# Patient Record
Sex: Female | Born: 1975 | Race: White | Hispanic: No | Marital: Married | Smoking: Never smoker
Health system: Southern US, Community
[De-identification: ages and names within clinical notes are randomized; demographics above are authoritative.]

## PROBLEM LIST (undated history)

## (undated) DIAGNOSIS — E782 Mixed hyperlipidemia: Secondary | ICD-10-CM

## (undated) DIAGNOSIS — R7303 Prediabetes: Secondary | ICD-10-CM

## (undated) DIAGNOSIS — Z30431 Encounter for routine checking of intrauterine contraceptive device: Secondary | ICD-10-CM

## (undated) DIAGNOSIS — Z973 Presence of spectacles and contact lenses: Secondary | ICD-10-CM

## (undated) DIAGNOSIS — Z8669 Personal history of other diseases of the nervous system and sense organs: Secondary | ICD-10-CM

## (undated) DIAGNOSIS — N8300 Follicular cyst of ovary, unspecified side: Secondary | ICD-10-CM

## (undated) DIAGNOSIS — Z6832 Body mass index (BMI) 32.0-32.9, adult: Secondary | ICD-10-CM

## (undated) DIAGNOSIS — I10 Essential (primary) hypertension: Secondary | ICD-10-CM

## (undated) DIAGNOSIS — R6882 Decreased libido: Secondary | ICD-10-CM

## (undated) DIAGNOSIS — E789 Disorder of lipoprotein metabolism, unspecified: Secondary | ICD-10-CM

## (undated) DIAGNOSIS — R87619 Unspecified abnormal cytological findings in specimens from cervix uteri: Secondary | ICD-10-CM

## (undated) DIAGNOSIS — E669 Obesity, unspecified: Secondary | ICD-10-CM

## (undated) DIAGNOSIS — N63 Unspecified lump in unspecified breast: Secondary | ICD-10-CM

## (undated) HISTORY — DX: Personal history of other diseases of the nervous system and sense organs: Z86.69

## (undated) HISTORY — DX: Disorder of lipoprotein metabolism, unspecified: E78.9

## (undated) HISTORY — DX: Encounter for routine checking of intrauterine contraceptive device: Z30.431

## (undated) HISTORY — PX: NO PAST SURGERIES: SHX2092

## (undated) HISTORY — DX: Unspecified lump in unspecified breast: N63.0

## (undated) HISTORY — DX: Follicular cyst of ovary, unspecified side: N83.00

## (undated) HISTORY — DX: Obesity, unspecified: E66.9

## (undated) HISTORY — DX: Body mass index (BMI) 32.0-32.9, adult: Z68.32

## (undated) HISTORY — DX: Decreased libido: R68.82

---

## 1999-07-03 ENCOUNTER — Other Ambulatory Visit: Admission: RE | Admit: 1999-07-03 | Discharge: 1999-07-03 | Payer: Self-pay | Admitting: *Deleted

## 2000-02-06 ENCOUNTER — Encounter: Payer: Self-pay | Admitting: General Surgery

## 2000-02-06 ENCOUNTER — Encounter: Admission: RE | Admit: 2000-02-06 | Discharge: 2000-02-06 | Payer: Self-pay | Admitting: Unknown Physician Specialty

## 2000-07-03 ENCOUNTER — Other Ambulatory Visit: Admission: RE | Admit: 2000-07-03 | Discharge: 2000-07-03 | Payer: Self-pay | Admitting: *Deleted

## 2001-06-22 ENCOUNTER — Inpatient Hospital Stay (HOSPITAL_COMMUNITY): Admission: AD | Admit: 2001-06-22 | Discharge: 2001-06-25 | Payer: Self-pay | Admitting: Obstetrics and Gynecology

## 2001-07-31 ENCOUNTER — Other Ambulatory Visit: Admission: RE | Admit: 2001-07-31 | Discharge: 2001-07-31 | Payer: Self-pay | Admitting: Obstetrics and Gynecology

## 2002-10-02 ENCOUNTER — Other Ambulatory Visit: Admission: RE | Admit: 2002-10-02 | Discharge: 2002-10-02 | Payer: Self-pay | Admitting: Obstetrics and Gynecology

## 2003-11-05 ENCOUNTER — Ambulatory Visit (HOSPITAL_COMMUNITY): Admission: RE | Admit: 2003-11-05 | Discharge: 2003-11-05 | Payer: Self-pay | Admitting: Orthopedic Surgery

## 2003-11-09 ENCOUNTER — Other Ambulatory Visit: Admission: RE | Admit: 2003-11-09 | Discharge: 2003-11-09 | Payer: Self-pay | Admitting: Obstetrics and Gynecology

## 2005-06-25 ENCOUNTER — Inpatient Hospital Stay (HOSPITAL_COMMUNITY): Admission: AD | Admit: 2005-06-25 | Discharge: 2005-06-27 | Payer: Self-pay | Admitting: Obstetrics and Gynecology

## 2005-06-25 ENCOUNTER — Encounter (INDEPENDENT_AMBULATORY_CARE_PROVIDER_SITE_OTHER): Payer: Self-pay | Admitting: *Deleted

## 2006-08-14 ENCOUNTER — Encounter: Admission: RE | Admit: 2006-08-14 | Discharge: 2006-08-14 | Payer: Self-pay | Admitting: Obstetrics and Gynecology

## 2007-02-21 ENCOUNTER — Encounter: Admission: RE | Admit: 2007-02-21 | Discharge: 2007-02-21 | Payer: Self-pay | Admitting: Obstetrics and Gynecology

## 2007-09-08 ENCOUNTER — Encounter: Admission: RE | Admit: 2007-09-08 | Discharge: 2007-09-08 | Payer: Self-pay | Admitting: Obstetrics and Gynecology

## 2008-09-20 ENCOUNTER — Encounter: Admission: RE | Admit: 2008-09-20 | Discharge: 2008-09-20 | Payer: Self-pay | Admitting: Obstetrics and Gynecology

## 2012-04-02 ENCOUNTER — Other Ambulatory Visit: Payer: Self-pay | Admitting: Obstetrics and Gynecology

## 2012-04-02 DIAGNOSIS — Z1231 Encounter for screening mammogram for malignant neoplasm of breast: Secondary | ICD-10-CM

## 2012-04-10 ENCOUNTER — Other Ambulatory Visit: Payer: Self-pay | Admitting: Obstetrics and Gynecology

## 2012-04-10 ENCOUNTER — Ambulatory Visit
Admission: RE | Admit: 2012-04-10 | Discharge: 2012-04-10 | Disposition: A | Discharge status: Home / Self Care | Payer: PRIVATE HEALTH INSURANCE | Source: Ambulatory Visit | Attending: Obstetrics and Gynecology | Admitting: Obstetrics and Gynecology

## 2012-04-10 DIAGNOSIS — N6320 Unspecified lump in the left breast, unspecified quadrant: Secondary | ICD-10-CM

## 2012-04-10 DIAGNOSIS — Z1231 Encounter for screening mammogram for malignant neoplasm of breast: Secondary | ICD-10-CM

## 2012-04-11 ENCOUNTER — Other Ambulatory Visit: Payer: Self-pay | Admitting: Obstetrics and Gynecology

## 2012-04-11 DIAGNOSIS — N6489 Other specified disorders of breast: Secondary | ICD-10-CM

## 2012-04-11 DIAGNOSIS — R922 Inconclusive mammogram: Secondary | ICD-10-CM

## 2012-04-11 DIAGNOSIS — N6320 Unspecified lump in the left breast, unspecified quadrant: Secondary | ICD-10-CM

## 2012-05-01 ENCOUNTER — Ambulatory Visit
Admission: RE | Admit: 2012-05-01 | Discharge: 2012-05-01 | Disposition: A | Discharge status: Home / Self Care | Payer: PRIVATE HEALTH INSURANCE | Source: Ambulatory Visit | Attending: Obstetrics and Gynecology | Admitting: Obstetrics and Gynecology

## 2012-05-01 DIAGNOSIS — N6320 Unspecified lump in the left breast, unspecified quadrant: Secondary | ICD-10-CM

## 2012-05-01 DIAGNOSIS — N6489 Other specified disorders of breast: Secondary | ICD-10-CM

## 2012-05-01 DIAGNOSIS — R922 Inconclusive mammogram: Secondary | ICD-10-CM

## 2012-06-18 HISTORY — PX: COLONOSCOPY WITH PROPOFOL: SHX5780

## 2013-04-10 ENCOUNTER — Other Ambulatory Visit: Payer: Self-pay | Admitting: Obstetrics and Gynecology

## 2013-04-10 DIAGNOSIS — N6320 Unspecified lump in the left breast, unspecified quadrant: Secondary | ICD-10-CM

## 2013-05-04 ENCOUNTER — Other Ambulatory Visit: Payer: PRIVATE HEALTH INSURANCE

## 2013-08-10 ENCOUNTER — Ambulatory Visit
Admission: RE | Admit: 2013-08-10 | Discharge: 2013-08-10 | Disposition: A | Discharge status: Home / Self Care | Payer: PRIVATE HEALTH INSURANCE | Source: Ambulatory Visit | Attending: Obstetrics and Gynecology | Admitting: Obstetrics and Gynecology

## 2013-08-10 DIAGNOSIS — N6320 Unspecified lump in the left breast, unspecified quadrant: Secondary | ICD-10-CM

## 2015-10-06 ENCOUNTER — Encounter: Payer: Self-pay | Admitting: Cardiovascular Disease

## 2015-10-06 ENCOUNTER — Ambulatory Visit (INDEPENDENT_AMBULATORY_CARE_PROVIDER_SITE_OTHER): Payer: PRIVATE HEALTH INSURANCE | Admitting: Cardiovascular Disease

## 2015-10-06 VITALS — BP 132/110 | HR 73 | Ht 71.0 in | Wt 242.2 lb

## 2015-10-06 DIAGNOSIS — I10 Essential (primary) hypertension: Secondary | ICD-10-CM | POA: Diagnosis not present

## 2015-10-06 DIAGNOSIS — E785 Hyperlipidemia, unspecified: Secondary | ICD-10-CM | POA: Diagnosis not present

## 2015-10-06 LAB — COMPREHENSIVE METABOLIC PANEL
ALT: 43 U/L — ABNORMAL HIGH (ref 6–29)
AST: 36 U/L — AB (ref 10–30)
Albumin: 4.6 g/dL (ref 3.6–5.1)
Alkaline Phosphatase: 60 U/L (ref 33–115)
BILIRUBIN TOTAL: 0.8 mg/dL (ref 0.2–1.2)
BUN: 12 mg/dL (ref 7–25)
CHLORIDE: 104 mmol/L (ref 98–110)
CO2: 26 mmol/L (ref 20–31)
CREATININE: 0.65 mg/dL (ref 0.50–1.10)
Calcium: 9.7 mg/dL (ref 8.6–10.2)
GLUCOSE: 91 mg/dL (ref 65–99)
Potassium: 4.6 mmol/L (ref 3.5–5.3)
SODIUM: 139 mmol/L (ref 135–146)
Total Protein: 7.4 g/dL (ref 6.1–8.1)

## 2015-10-06 LAB — LIPID PANEL
CHOL/HDL RATIO: 6.9 ratio — AB (ref <=5.0)
Cholesterol: 207 mg/dL — ABNORMAL HIGH (ref 125–200)
HDL: 30 mg/dL — AB (ref >=46)
LDL CALC: 139 mg/dL — AB (ref <130)
Triglycerides: 190 mg/dL — ABNORMAL HIGH (ref <150)
VLDL: 38 mg/dL — AB (ref <30)

## 2015-10-06 NOTE — Patient Instructions (Addendum)
Medication Instructions:  Your physician recommends that you continue on your current medications as directed.    Labwork: TODAY - cholesterol, complete metabolic panel   Testing/Procedures: None Ordered   Follow-Up: Your physician recommends that you schedule a follow-up appointment in: 3 months with Dr. Elease HashimotoNahser   If you need a refill on your cardiac medications before your next appointment, please call your pharmacy.   Thank you for choosing [facility]! Eligha BridegroomMichelle Swinyer, RN [phone]

## 2015-10-06 NOTE — Progress Notes (Signed)
Cardiology Office Note   Date:  10/06/2015   ID:  Joan Allen, DOB 1976-05-10, MRN [id number]  PCP:  Ginette Otto, MD  Cardiologist:   Vesta Mixer, MD   Chief Complaint  Patient presents with  . Hyperlipidemia   Problem list 1. Hyperlipidemia.   History of Present Illness: Joan Allen is a 40 y.o. female who presents for further evaluation of her hyperlipidemia.  She's had elevated cholesterol level since her 20s.  She's never been put on any medications. She's tried to improve her diet. No CP with exertion.   Does have some DOE . No DOE doing normal household activities.    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    Past Medical History  Diagnosis Date  . Surveillance of intrauterine contraceptive device   . History of migraine headaches   . Body mass index 32.0-32.9, adult   . Decreased libido   . Follicular cyst of ovary   . Lump or mass in breast   . Abnormal cholesterol test   . Obese     Past Surgical History  Procedure Laterality Date  . No past surgeries       Current Outpatient Prescriptions  Medication Sig Dispense Refill  . levonorgestrel (MIRENA) 20 MCG/24HR IUD 1 each by Intrauterine route once.     No current facility-administered medications for this visit.    Allergies:   Review of patient's allergies indicates no known allergies.    Social History:  The patient  reports that she has never smoked. She does not have any smokeless tobacco history on file. She reports that she drinks alcohol. She reports that she does not use illicit drugs.   Family History:  The patient's family history includes Cancer in her father and maternal grandmother; Diabetes in her maternal grandfather; Hypertension in her mother; Kidney Stones in her mother; Stroke in her maternal grandfather.    ROS:  Please see the history of present illness.    Review of  Systems: Constitutional:  denies fever, chills, diaphoresis, appetite change and fatigue.  HEENT: denies photophobia, eye pain, redness, hearing loss, ear pain, congestion, sore throat, rhinorrhea, sneezing, neck pain, neck stiffness and tinnitus.  Respiratory: admits to  DOE,  .  Cardiovascular: denies chest pain, palpitations and leg swelling.  Gastrointestinal: denies nausea, vomiting, abdominal pain, diarrhea, constipation, blood in stool.  Genitourinary: denies dysuria, urgency, frequency, hematuria, flank pain and difficulty urinating.  Musculoskeletal: denies  myalgias, back pain, joint swelling, arthralgias and gait problem.   Skin: denies pallor, rash and wound.  Neurological: denies dizziness, seizures, syncope, weakness, light-headedness, numbness and headaches.   Hematological: denies adenopathy, easy bruising, personal or family bleeding history.  Psychiatric/ Behavioral: denies suicidal ideation, mood changes, confusion, nervousness, sleep disturbance and agitation.       All other systems are reviewed and negative.    PHYSICAL EXAM: VS:  BP 132/110 mmHg  Pulse 73  Ht  (1.803 m)  Wt 242 lb 3.2 oz (109.861 kg)  BMI 33.79 kg/m2 , BMI Body mass index is 33.79 kg/(m^2). GEN: Well nourished, well developed, in no acute distress HEENT: normal Neck: no JVD, carotid bruits, or masses Cardiac: RRR; no murmurs, rubs, or gallops,no edema  Respiratory:  clear to auscultation bilaterally, normal work of breathing GI: soft, nontender, nondistended, + BS MS: no deformity or atrophy Skin: warm and dry, no rash Neuro:  Strength and sensation are intact Psych: normal   EKG:  EKG is ordered today. The ekg ordered today demonstrates  NSR at 73.  Normal ECG     Recent Labs: No results found for requested labs within last 365 days.    Lipid Panel No results found for: CHOL, TRIG, HDL, CHOLHDL, VLDL, LDLCALC, LDLDIRECT    Wt Readings from Last 3 Encounters:  10/06/15  242 lb 3.2 oz (109.861 kg)      Other studies Reviewed: Additional studies/ records that were reviewed today include: . Review of the above records demonstrates:    ASSESSMENT AND PLAN:  1.  Hyperlipidemia :   She was told that she had elevated cholesterol levels in her 20s. She's never been on any specific diet. She's never taken any medications. I strongly advised her to work on improved diet. I've also advised her to exercise on a regular basis and to work on weight loss.   2. Hypertension: Her diastolic pressure remains elevated. She was on vacation this past week and ate  out quite a bit. I've advised her to eat a low-salt diet.  I'll see her in 3 month for follow-up visit.   Current medicines are reviewed at length with the patient today.  The patient does not have concerns regarding medicines.  The following changes have been made:  no change  Labs/ tests ordered today include:  No orders of the defined types were placed in this encounter.     Disposition:   FU with me in 3 months      Carmen Vallecillo, Deloris PingPhilip J, MD  10/06/2015 10:50 AM    [facility] [address], [place], [place]  [address] Phone: [phone](336) [phone]; Fax: [phone](336) [phone]   [facility]  [address] [place], [place]  [address] [phone]   Fax [phone]

## 2015-10-10 ENCOUNTER — Other Ambulatory Visit: Payer: Self-pay | Admitting: Nurse Practitioner

## 2015-10-10 MED ORDER — ATORVASTATIN CALCIUM 40 MG PO TABS: 40.0000 mg | ORAL_TABLET | Freq: Every day | ORAL | Status: DC

## 2016-01-05 ENCOUNTER — Ambulatory Visit (INDEPENDENT_AMBULATORY_CARE_PROVIDER_SITE_OTHER): Payer: PRIVATE HEALTH INSURANCE | Admitting: Cardiovascular Disease

## 2016-01-05 ENCOUNTER — Encounter (INDEPENDENT_AMBULATORY_CARE_PROVIDER_SITE_OTHER): Payer: Self-pay

## 2016-01-05 ENCOUNTER — Encounter: Payer: Self-pay | Admitting: Cardiovascular Disease

## 2016-01-05 VITALS — BP 116/84 | HR 80 | Ht 71.0 in | Wt 232.1 lb

## 2016-01-05 DIAGNOSIS — E785 Hyperlipidemia, unspecified: Secondary | ICD-10-CM

## 2016-01-05 LAB — LIPID PANEL
CHOL/HDL RATIO: 3.7 ratio (ref <=5.0)
Cholesterol: 114 mg/dL — ABNORMAL LOW (ref 125–200)
HDL: 31 mg/dL — ABNORMAL LOW (ref >=46)
LDL Cholesterol: 54 mg/dL (ref <130)
Triglycerides: 143 mg/dL (ref <150)
VLDL: 29 mg/dL (ref <30)

## 2016-01-05 LAB — COMPREHENSIVE METABOLIC PANEL
ALBUMIN: 4.3 g/dL (ref 3.6–5.1)
ALK PHOS: 65 U/L (ref 33–115)
ALT: 25 U/L (ref 6–29)
AST: 22 U/L (ref 10–30)
BILIRUBIN TOTAL: 0.7 mg/dL (ref 0.2–1.2)
BUN: 9 mg/dL (ref 7–25)
CO2: 25 mmol/L (ref 20–31)
CREATININE: 0.7 mg/dL (ref 0.50–1.10)
Calcium: 9.5 mg/dL (ref 8.6–10.2)
Chloride: 103 mmol/L (ref 98–110)
GLUCOSE: 91 mg/dL (ref 65–99)
Potassium: 4.3 mmol/L (ref 3.5–5.3)
SODIUM: 138 mmol/L (ref 135–146)
Total Protein: 6.9 g/dL (ref 6.1–8.1)

## 2016-01-05 NOTE — Patient Instructions (Signed)
Medication Instructions:  Your physician recommends that you continue on your current medications as directed. Please refer to the Current Medication list given to you today.   Labwork: TODAY - cholesterol, complete metabolic panel   Testing/Procedures: None Ordered   Follow-Up: Your physician wants you to follow-up in: 1 year with Dr. Nahser.  You will receive a reminder letter in the mail two months in advance. If you don't receive a letter, please call our office to schedule the follow-up appointment.   If you need a refill on your cardiac medications before your next appointment, please call your pharmacy.   Thank you for choosing [facility]! Kimiah Hibner, RN [phone]    

## 2016-01-05 NOTE — Progress Notes (Signed)
Cardiology Office Note   Date:  01/05/2016   ID:  Joan Allen, DOB [birth year], MRN [id number]  PCP:  Ginette Otto, MD  Cardiologist:   Kristeen Miss, MD   Chief Complaint  Patient presents with  . Follow-up   Problem list 1. Hyperlipidemia.   October 06, 2015   Joan Allen is a 40 y.o. female who presents for further evaluation of her hyperlipidemia.  She's had elevated cholesterol level since her 20s.  She's never been put on any medications. She's tried to improve her diet. No CP with exertion.   Does have some DOE . No DOE doing normal household activities.   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  January 05, 2016  Joan Allen is seen today for follow up  Doing well. Walking some  Work is stressful  Trying to watch her diet   Past Medical History  Diagnosis Date  . Surveillance of intrauterine contraceptive device   . History of migraine headaches   . Body mass index 32.0-32.9, adult   . Decreased libido   . Follicular cyst of ovary   . Lump or mass in breast   . Abnormal cholesterol test   . Obese     Past Surgical History  Procedure Laterality Date  . No past surgeries       Current Outpatient Prescriptions  Medication Sig Dispense Refill  . atorvastatin (LIPITOR) 40 MG tablet Take 1 tablet (40 mg total) by mouth daily. 30 tablet 11  . levonorgestrel (MIRENA) 20 MCG/24HR IUD 1 each by Intrauterine route once.     No current facility-administered medications for this visit.    Allergies:   Review of patient's allergies indicates no known allergies.    Social History:  The patient  reports that she has never smoked. She does not have any smokeless tobacco history on file. She reports that she drinks alcohol. She reports that she does not use illicit drugs.   Family History:  The patient's family history includes Cancer in her father and maternal grandmother; Diabetes in  her maternal grandfather; Hypertension in her mother; Kidney Stones in her mother; Stroke in her maternal grandfather.    ROS:  Please see the history of present illness.    Review of Systems: Constitutional:  denies fever, chills, diaphoresis, appetite change and fatigue.  HEENT: denies photophobia, eye pain, redness, hearing loss, ear pain, congestion, sore throat, rhinorrhea, sneezing, neck pain, neck stiffness and tinnitus.  Respiratory: admits to  DOE,  .  Cardiovascular: denies chest pain, palpitations and leg swelling.  Gastrointestinal: denies nausea, vomiting, abdominal pain, diarrhea, constipation, blood in stool.  Genitourinary: denies dysuria, urgency, frequency, hematuria, flank pain and difficulty urinating.  Musculoskeletal: denies  myalgias, back pain, joint swelling, arthralgias and gait problem.   Skin: denies pallor, rash and wound.  Neurological: denies dizziness, seizures, syncope, weakness, light-headedness, numbness and headaches.   Hematological: denies adenopathy, easy bruising, personal or family bleeding history.  Psychiatric/ Behavioral: denies suicidal ideation, mood changes, confusion, nervousness, sleep disturbance and agitation.       All other systems are reviewed and negative.    PHYSICAL EXAM: VS:  BP 116/84 mmHg  Pulse 80  Ht  (1.803 m)  Wt 232 lb 1.9 oz (105.289 kg)  BMI 32.39 kg/m2  SpO2 96% , BMI Body mass index is 32.39 kg/(m^2). GEN: Well nourished, well developed, in no acute distress  HEENT: normal Neck: no JVD, carotid bruits, or masses Cardiac: RRR; no murmurs, rubs, or gallops,no edema  Respiratory:  clear to auscultation bilaterally, normal work of breathing GI: soft, nontender, nondistended, + BS MS: no deformity or atrophy Skin: warm and dry, no rash Neuro:  Strength and sensation are intact Psych: normal   EKG:  EKG is ordered today. The ekg ordered today demonstrates  NSR at 73.  Normal ECG     Recent  Labs: 10/06/2015: ALT 43*; BUN 12; Creat 0.65; Potassium 4.6; Sodium 139    Lipid Panel    Component Value Date/Time   CHOL 207* 10/06/2015 1114   TRIG 190* 10/06/2015 1114   HDL 30* 10/06/2015 1114   CHOLHDL 6.9* 10/06/2015 1114   VLDL 38* 10/06/2015 1114   LDLCALC 139* 10/06/2015 1114      Wt Readings from Last 3 Encounters:  01/05/16 232 lb 1.9 oz (105.289 kg)  10/06/15 242 lb 3.2 oz (109.861 kg)      Other studies Reviewed: Additional studies/ records that were reviewed today include: . Review of the above records demonstrates:    ASSESSMENT AND PLAN:  1.  Hyperlipidemia :   She is on Atorvastatin 40 mg a day  Check lipids today    2. Hypertension: Her diastolic pressure remains elevated. She was on vacation this past week and ate  out quite a bit. I've advised her to eat a low-salt diet.  3. Obesity :   Advised continued diet and exercise    Will see her in 1 year   Current medicines are reviewed at length with the patient today.  The patient does not have concerns regarding medicines.  The following changes have been made:  no change  Labs/ tests ordered today include:  No orders of the defined types were placed in this encounter.      Kristeen MissPhilip Malon Branton, MD  01/05/2016 8:33 AM    [facility] [address], [place], [place]  [address] Phone: [phone]([phone]; Fax: [phone]([phone]   [facility]  [address] [place], [place]  [address] [phone]) [phone]   Fax [phone]) [phone]

## 2016-06-18 HISTORY — PX: CATARACT EXTRACTION W/ INTRAOCULAR LENS IMPLANT: SHX1309

## 2016-10-22 ENCOUNTER — Encounter: Payer: Self-pay | Admitting: Cardiovascular Disease

## 2016-10-23 ENCOUNTER — Other Ambulatory Visit: Payer: Self-pay | Admitting: *Deleted

## 2016-10-23 MED ORDER — ATORVASTATIN CALCIUM 40 MG PO TABS: 40.0000 mg | ORAL_TABLET | Freq: Every day | ORAL | 0 refills | Status: DC

## 2017-02-08 ENCOUNTER — Encounter: Payer: Self-pay | Admitting: Cardiovascular Disease

## 2017-02-08 ENCOUNTER — Ambulatory Visit (INDEPENDENT_AMBULATORY_CARE_PROVIDER_SITE_OTHER): Payer: PRIVATE HEALTH INSURANCE | Admitting: Cardiovascular Disease

## 2017-02-08 VITALS — BP 124/90 | HR 83 | Ht 71.0 in | Wt 241.0 lb

## 2017-02-08 DIAGNOSIS — E782 Mixed hyperlipidemia: Secondary | ICD-10-CM

## 2017-02-08 DIAGNOSIS — I10 Essential (primary) hypertension: Secondary | ICD-10-CM | POA: Diagnosis not present

## 2017-02-08 LAB — LIPID PANEL
CHOL/HDL RATIO: 4.5 ratio — AB (ref 0.0–4.4)
Cholesterol, Total: 134 mg/dL (ref 100–199)
HDL: 30 mg/dL — AB (ref >39)
LDL CALC: 78 mg/dL (ref 0–99)
TRIGLYCERIDES: 130 mg/dL (ref 0–149)
VLDL CHOLESTEROL CAL: 26 mg/dL (ref 5–40)

## 2017-02-08 LAB — HEPATIC FUNCTION PANEL
ALT: 28 IU/L (ref 0–32)
AST: 24 IU/L (ref 0–40)
Albumin: 4.5 g/dL (ref 3.5–5.5)
Alkaline Phosphatase: 67 IU/L (ref 39–117)
BILIRUBIN, DIRECT: 0.19 mg/dL (ref 0.00–0.40)
Bilirubin Total: 0.7 mg/dL (ref 0.0–1.2)
TOTAL PROTEIN: 6.9 g/dL (ref 6.0–8.5)

## 2017-02-08 LAB — BASIC METABOLIC PANEL
BUN / CREAT RATIO: 15 (ref 9–23)
BUN: 10 mg/dL (ref 6–24)
CHLORIDE: 102 mmol/L (ref 96–106)
CO2: 22 mmol/L (ref 20–29)
Calcium: 9.6 mg/dL (ref 8.7–10.2)
Creatinine, Ser: 0.68 mg/dL (ref 0.57–1.00)
GFR calc non Af Amer: 108 mL/min/{1.73_m2} (ref >59)
GFR, EST AFRICAN AMERICAN: 125 mL/min/{1.73_m2} (ref >59)
GLUCOSE: 98 mg/dL (ref 65–99)
POTASSIUM: 4.5 mmol/L (ref 3.5–5.2)
SODIUM: 139 mmol/L (ref 134–144)

## 2017-02-08 NOTE — Patient Instructions (Signed)
Medication Instructions:  Your physician recommends that you continue on your current medications as directed. Please refer to the Current Medication list given to you today.   Labwork: TODAY - cholesterol, basic metabolic panel, liver panel   Testing/Procedures: None Ordered   Follow-Up: Your physician wants you to follow-up in: 1 year with Dr. Nahser.  You will receive a reminder letter in the mail two months in advance. If you don't receive a letter, please call our office to schedule the follow-up appointment.   If you need a refill on your cardiac medications before your next appointment, please call your pharmacy.   Thank you for choosing [facility]! Raylan Hanton, RN [phone]    

## 2017-02-08 NOTE — Progress Notes (Signed)
Cardiology Office Note   Date:  02/08/2017   ID:  Joan Allen, DOB 04-13-1976, MRN [id number]  PCP:  Merlene Laughter, MD  Cardiologist:   Kristeen Miss, MD   Chief Complaint  Patient presents with  . Follow-up    Hyperlipidemia    Problem list 1. Hyperlipidemia.   October 06, 2015   Joan Allen is a 41 y.o. female who presents for further evaluation of her hyperlipidemia.  She's had elevated cholesterol level since her 20s.  She's never been put on any medications. She's tried to improve her diet. No CP with exertion.   Does have some DOE . No DOE doing normal household activities.   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  January 05, 2016  Joan Allen is seen today for follow up  Doing well. Walking some  Work is stressful  Trying to watch her diet   Aug. 24, 2018:  Joan Allen is seen today for follow up of hyperlipidemia  Has not been exercising much  No CP or dyspnea  Past Medical History:  Diagnosis Date  . Abnormal cholesterol test   . Body mass index 32.0-32.9, adult   . Decreased libido   . Follicular cyst of ovary   . History of migraine headaches   . Lump or mass in breast   . Obese   . Surveillance of intrauterine contraceptive device     Past Surgical History:  Procedure Laterality Date  . NO PAST SURGERIES       Current Outpatient Prescriptions  Medication Sig Dispense Refill  . atorvastatin (LIPITOR) 40 MG tablet Take 1 tablet (40 mg total) by mouth daily. 90 tablet 0  . levonorgestrel (MIRENA) 20 MCG/24HR IUD 1 each by Intrauterine route once.     No current facility-administered medications for this visit.     Allergies:   Patient has no known allergies.    Social History:  The patient  reports that she has never smoked. She has never used smokeless tobacco. She reports that she drinks alcohol. She reports that she does not use drugs.   Family History:  The  patient's family history includes Cancer in her father and maternal grandmother; Diabetes in her maternal grandfather; Hypertension in her mother; Kidney Stones in her mother; Stroke in her maternal grandfather.    ROS:  Please see the history of present illness.    Review of Systems: Constitutional:  denies fever, chills, diaphoresis, appetite change and fatigue.  HEENT: denies photophobia, eye pain, redness, hearing loss, ear pain, congestion, sore throat, rhinorrhea, sneezing, neck pain, neck stiffness and tinnitus.  Respiratory: admits to  DOE,  .  Cardiovascular: denies chest pain, palpitations and leg swelling.  Gastrointestinal: denies nausea, vomiting, abdominal pain, diarrhea, constipation, blood in stool.  Genitourinary: denies dysuria, urgency, frequency, hematuria, flank pain and difficulty urinating.  Musculoskeletal: denies  myalgias, back pain, joint swelling, arthralgias and gait problem.   Skin: denies pallor, rash and wound.  Neurological: denies dizziness, seizures, syncope, weakness, light-headedness, numbness and headaches.   Hematological: denies adenopathy, easy bruising, personal or family bleeding history.  Psychiatric/ Behavioral: denies suicidal ideation, mood changes, confusion, nervousness, sleep disturbance and agitation.       All other systems are reviewed and negative.    PHYSICAL EXAM: VS:  BP 124/90   Pulse 83   Ht 5\' 11"  (1.803 m)   Wt 241 lb (109.3 kg)  BMI 33.61 kg/m  , BMI Body mass index is 33.61 kg/m. GEN: Well nourished, well developed, in no acute distress  HEENT: normal  Neck: no JVD, carotid bruits, or masses Cardiac: RRR; no murmurs, rubs, or gallops,no edema  Respiratory:  clear to auscultation bilaterally, normal work of breathing GI: soft, nontender, nondistended, + BS MS: no deformity or atrophy  Skin: warm and dry, no rash Neuro:  Strength and sensation are intact Psych: normal   EKG:  EKG is ordered today. The ekg  ordered today demonstrates  NSR at 73.  Normal ECG     Recent Labs: No results found for requested labs within last 8760 hours.    Lipid Panel    Component Value Date/Time   CHOL 114 (L) 01/05/2016 0843   TRIG 143 01/05/2016 0843   HDL 31 (L) 01/05/2016 0843   CHOLHDL 3.7 01/05/2016 0843   VLDL 29 01/05/2016 0843   LDLCALC 54 01/05/2016 0843      Wt Readings from Last 3 Encounters:  02/08/17 241 lb (109.3 kg)  01/05/16 232 lb 1.9 oz (105.3 kg)  10/06/15 242 lb 3.2 oz (109.9 kg)      Other studies Reviewed: Additional studies/ records that were reviewed today include: . Review of the above records demonstrates:    ASSESSMENT AND PLAN:  1.  Hyperlipidemia :   She is on Atorvastatin 40 mg a day  Check lipids today   2. Hypertension:  BP is better.  Continue diet and exercise program   3. Obesity :   Advised continued diet and exercise  She knows that she needs to work on these issues   Will see her in 1 year   Current medicines are reviewed at length with the patient today.  The patient does not have concerns regarding medicines.  The following changes have been made:  no change  Labs/ tests ordered today include:  No orders of the defined types were placed in this encounter.   Kristeen Miss, MD  02/08/2017 9:22 AM    [facility] [address] [place], [place], [place]  [address] Phone: [phone]; Fax: [phone]

## 2017-06-04 ENCOUNTER — Other Ambulatory Visit: Payer: Self-pay | Admitting: Cardiovascular Disease

## 2018-02-20 DIAGNOSIS — Z01419 Encounter for gynecological examination (general) (routine) without abnormal findings: Secondary | ICD-10-CM | POA: Diagnosis not present

## 2018-02-20 DIAGNOSIS — Z1231 Encounter for screening mammogram for malignant neoplasm of breast: Secondary | ICD-10-CM | POA: Diagnosis not present

## 2018-02-20 DIAGNOSIS — Z1151 Encounter for screening for human papillomavirus (HPV): Secondary | ICD-10-CM | POA: Diagnosis not present

## 2018-02-20 DIAGNOSIS — Z6834 Body mass index (BMI) 34.0-34.9, adult: Secondary | ICD-10-CM | POA: Diagnosis not present

## 2018-04-17 ENCOUNTER — Ambulatory Visit (INDEPENDENT_AMBULATORY_CARE_PROVIDER_SITE_OTHER): Payer: BLUE CROSS/BLUE SHIELD | Admitting: Cardiovascular Disease

## 2018-04-17 ENCOUNTER — Encounter: Payer: Self-pay | Admitting: Nurse Practitioner

## 2018-04-17 ENCOUNTER — Encounter: Payer: Self-pay | Admitting: Cardiovascular Disease

## 2018-04-17 VITALS — BP 132/90 | HR 94 | Ht 71.0 in | Wt 242.0 lb

## 2018-04-17 DIAGNOSIS — I10 Essential (primary) hypertension: Secondary | ICD-10-CM

## 2018-04-17 DIAGNOSIS — E782 Mixed hyperlipidemia: Secondary | ICD-10-CM | POA: Diagnosis not present

## 2018-04-17 NOTE — Progress Notes (Signed)
Cardiology Office Note   Date:  04/17/2018   ID:  Joan Allen, DOB 07-31-1975, MRN [id number]  PCP:  Merlene Laughter, MD  Cardiologist:   Kristeen Miss, MD   Chief Complaint  Patient presents with  . Hyperlipidemia   Problem list 1. Hyperlipidemia.   October 06, 2015   Joan Allen is a 42 y.o. female who presents for further evaluation of her hyperlipidemia.  She's had elevated cholesterol level since her 20s.  She's never been put on any medications. She's tried to improve her diet. No CP with exertion.   Does have some DOE . No DOE doing normal household activities.   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  January 05, 2016  Joan Allen is seen today for follow up  Doing well. Walking some  Work is stressful  Trying to watch her diet   Aug. 24, 2018:  Joan Allen is seen today for follow up of hyperlipidemia  Has not been exercising much  No CP or dyspnea  April 17, 2018: Early seen today for follow-up visit.  She has a history of hyperlipidemia. Is getting some exercise  Walks the dog - no exercise except for that  Wt today is 242.  ( previous weight is 241 )  No Cp or dyspnea.      Past Medical History:  Diagnosis Date  . Abnormal cholesterol test   . Body mass index 32.0-32.9, adult   . Decreased libido   . Follicular cyst of ovary   . History of migraine headaches   . Lump or mass in breast   . Obese   . Surveillance of intrauterine contraceptive device     Past Surgical History:  Procedure Laterality Date  . NO PAST SURGERIES       Current Outpatient Medications  Medication Sig Dispense Refill  . atorvastatin (LIPITOR) 40 MG tablet TAKE 1 TABLET BY MOUTH ONCE DAILY 90 tablet 2  . levonorgestrel (MIRENA) 20 MCG/24HR IUD 1 each by Intrauterine route once.     No current facility-administered medications for this visit.     Allergies:   Patient has no known allergies.      Social History:  The patient  reports that she has never smoked. She has never used smokeless tobacco. She reports that she drinks alcohol. She reports that she does not use drugs.   Family History:  The patient's family history includes Cancer in her father and maternal grandmother; Diabetes in her maternal grandfather; Hypertension in her mother; Kidney Stones in her mother; Stroke in her maternal grandfather.    ROS:  Please see the history of present illness.      EKG:   April 17, 2018: Normal sinus rhythm at 94 beats minute.  Normal EKG    Recent Labs: No results found for requested labs within last 8760 hours.    Lipid Panel    Component Value Date/Time   CHOL 134 02/08/2017 0931   TRIG 130 02/08/2017 0931   HDL 30 (L) 02/08/2017 0931   CHOLHDL 4.5 (H) 02/08/2017 0931   CHOLHDL 3.7 01/05/2016 0843   VLDL 29 01/05/2016 0843   LDLCALC 78 02/08/2017 0931      Wt Readings from Last 3 Encounters:  04/17/18 242 lb (109.8 kg)  02/08/17 241 lb (109.3 kg)  01/05/16 232 lb 1.9 oz (105.3 kg)      Other studies Reviewed: Additional studies/  records that were reviewed today include: . Review of the above records demonstrates:    ASSESSMENT AND PLAN:  1.  Hyperlipidemia :    He is cholesterol levels last year looks good.  She is still on atorvastatin.  Will check levels today. Urged her to work on a good diet, exercise, weight loss program.  She fairly busy and really does not have much time to work out.  2. Hypertension:    Pressure looks good.  Advised her to continue to work on diet, exercise, weight loss program.  She eats a fairly low-salt diet.  3. Obesity :      Continue to work on weight loss.  Will see her in 1 year   Current medicines are reviewed at length with the patient today.  The patient does not have concerns regarding medicines.  The following changes have been made:  no change  Labs/ tests ordered today include:  No orders of the defined  types were placed in this encounter.   Kristeen Miss, MD  04/17/2018 3:49 PM    [facility] [address] [place], [place], [place]  [address] Phone: [phone]; Fax: [phone]

## 2018-04-17 NOTE — Patient Instructions (Signed)
Medication Instructions:  Your physician recommends that you continue on your current medications as directed. Please refer to the Current Medication list given to you today.  If you need a refill on your cardiac medications before your next appointment, please call your pharmacy.    Lab work: TODAY - cholesterol, liver panel, basic metabolic panel   Testing/Procedures: None Ordered    Follow-Up: At [facility], you and your health needs are our priority.  As part of our continuing mission to provide you with exceptional heart care, we have created designated Provider Care Teams.  These Care Teams include your primary Cardiologist (physician) and Advanced Practice Providers (APPs -  Physician Assistants and Nurse Practitioners) who all work together to provide you with the care you need, when you need it You will need a follow up appointment in:  1 year.  Please call our office 2 months in advance to schedule this appointment.  You may see Kristeen Miss, MD or one of the following Advanced Practice Providers on your designated Care Team: Tereso Newcomer, PA-C [place], [place] . Berton Bon, NP

## 2018-04-18 LAB — BASIC METABOLIC PANEL
BUN/Creatinine Ratio: 17 (ref 9–23)
BUN: 13 mg/dL (ref 6–24)
CALCIUM: 10.1 mg/dL (ref 8.7–10.2)
CHLORIDE: 104 mmol/L (ref 96–106)
CO2: 18 mmol/L — ABNORMAL LOW (ref 20–29)
CREATININE: 0.75 mg/dL (ref 0.57–1.00)
GFR calc non Af Amer: 98 mL/min/{1.73_m2} (ref >59)
GFR, EST AFRICAN AMERICAN: 113 mL/min/{1.73_m2} (ref >59)
Glucose: 83 mg/dL (ref 65–99)
Potassium: 4.1 mmol/L (ref 3.5–5.2)
Sodium: 141 mmol/L (ref 134–144)

## 2018-04-18 LAB — HEPATIC FUNCTION PANEL
ALK PHOS: 75 IU/L (ref 39–117)
ALT: 26 IU/L (ref 0–32)
AST: 27 IU/L (ref 0–40)
Albumin: 4.8 g/dL (ref 3.5–5.5)
BILIRUBIN TOTAL: 0.8 mg/dL (ref 0.0–1.2)
BILIRUBIN, DIRECT: 0.18 mg/dL (ref 0.00–0.40)
TOTAL PROTEIN: 7.3 g/dL (ref 6.0–8.5)

## 2018-04-18 LAB — LIPID PANEL
Chol/HDL Ratio: 5.3 ratio — ABNORMAL HIGH (ref 0.0–4.4)
Cholesterol, Total: 171 mg/dL (ref 100–199)
HDL: 32 mg/dL — AB (ref >39)
LDL Calculated: 106 mg/dL — ABNORMAL HIGH (ref 0–99)
TRIGLYCERIDES: 167 mg/dL — AB (ref 0–149)
VLDL Cholesterol Cal: 33 mg/dL (ref 5–40)

## 2018-04-18 MED ORDER — ATORVASTATIN CALCIUM 40 MG PO TABS: 40.0000 mg | ORAL_TABLET | Freq: Every day | ORAL | 3 refills | Status: DC

## 2018-04-18 NOTE — Addendum Note (Signed)
Addended by: Levi Aland on: 04/18/2018 12:57 PM   Modules accepted: Orders

## 2018-07-28 ENCOUNTER — Other Ambulatory Visit: Payer: Self-pay | Admitting: Nurse Practitioner

## 2018-07-28 MED ORDER — ATORVASTATIN CALCIUM 40 MG PO TABS: 40.0000 mg | ORAL_TABLET | Freq: Every day | ORAL | 3 refills | Status: DC

## 2018-11-04 DIAGNOSIS — H40013 Open angle with borderline findings, low risk, bilateral: Secondary | ICD-10-CM | POA: Diagnosis not present

## 2018-12-26 DIAGNOSIS — H40019 Open angle with borderline findings, low risk, unspecified eye: Secondary | ICD-10-CM | POA: Diagnosis not present

## 2019-04-07 DIAGNOSIS — Z1151 Encounter for screening for human papillomavirus (HPV): Secondary | ICD-10-CM | POA: Diagnosis not present

## 2019-04-07 DIAGNOSIS — Z23 Encounter for immunization: Secondary | ICD-10-CM | POA: Diagnosis not present

## 2019-04-07 DIAGNOSIS — Z01419 Encounter for gynecological examination (general) (routine) without abnormal findings: Secondary | ICD-10-CM | POA: Diagnosis not present

## 2019-04-07 DIAGNOSIS — Z1231 Encounter for screening mammogram for malignant neoplasm of breast: Secondary | ICD-10-CM | POA: Diagnosis not present

## 2019-04-07 DIAGNOSIS — Z6834 Body mass index (BMI) 34.0-34.9, adult: Secondary | ICD-10-CM | POA: Diagnosis not present

## 2019-05-28 ENCOUNTER — Other Ambulatory Visit: Payer: Self-pay

## 2019-05-28 DIAGNOSIS — Z20822 Contact with and (suspected) exposure to covid-19: Secondary | ICD-10-CM

## 2019-05-31 ENCOUNTER — Emergency Department (HOSPITAL_COMMUNITY): Payer: BC Managed Care – PPO

## 2019-05-31 ENCOUNTER — Other Ambulatory Visit: Payer: Self-pay

## 2019-05-31 ENCOUNTER — Encounter (HOSPITAL_COMMUNITY): Payer: Self-pay | Admitting: Emergency Medicine

## 2019-05-31 ENCOUNTER — Inpatient Hospital Stay (HOSPITAL_COMMUNITY)
Admission: EM | Admit: 2019-05-31 | Discharge: 2019-06-04 | DRG: 177 | Disposition: A | Discharge status: Home / Self Care | Payer: BC Managed Care – PPO | Attending: Family Medicine | Admitting: Family Medicine

## 2019-05-31 DIAGNOSIS — Z8249 Family history of ischemic heart disease and other diseases of the circulatory system: Secondary | ICD-10-CM

## 2019-05-31 DIAGNOSIS — R6510 Systemic inflammatory response syndrome (SIRS) of non-infectious origin without acute organ dysfunction: Secondary | ICD-10-CM | POA: Diagnosis present

## 2019-05-31 DIAGNOSIS — N179 Acute kidney failure, unspecified: Secondary | ICD-10-CM | POA: Diagnosis present

## 2019-05-31 DIAGNOSIS — Z79899 Other long term (current) drug therapy: Secondary | ICD-10-CM

## 2019-05-31 DIAGNOSIS — R509 Fever, unspecified: Secondary | ICD-10-CM | POA: Diagnosis not present

## 2019-05-31 DIAGNOSIS — R0602 Shortness of breath: Secondary | ICD-10-CM | POA: Diagnosis not present

## 2019-05-31 DIAGNOSIS — D6959 Other secondary thrombocytopenia: Secondary | ICD-10-CM | POA: Diagnosis not present

## 2019-05-31 DIAGNOSIS — E669 Obesity, unspecified: Secondary | ICD-10-CM | POA: Diagnosis present

## 2019-05-31 DIAGNOSIS — U071 COVID-19: Secondary | ICD-10-CM

## 2019-05-31 DIAGNOSIS — R739 Hyperglycemia, unspecified: Secondary | ICD-10-CM | POA: Diagnosis not present

## 2019-05-31 DIAGNOSIS — D696 Thrombocytopenia, unspecified: Secondary | ICD-10-CM | POA: Diagnosis present

## 2019-05-31 DIAGNOSIS — Z823 Family history of stroke: Secondary | ICD-10-CM

## 2019-05-31 DIAGNOSIS — Z6833 Body mass index (BMI) 33.0-33.9, adult: Secondary | ICD-10-CM | POA: Diagnosis not present

## 2019-05-31 DIAGNOSIS — R52 Pain, unspecified: Secondary | ICD-10-CM | POA: Diagnosis not present

## 2019-05-31 DIAGNOSIS — T380X5A Adverse effect of glucocorticoids and synthetic analogues, initial encounter: Secondary | ICD-10-CM | POA: Diagnosis not present

## 2019-05-31 DIAGNOSIS — J9601 Acute respiratory failure with hypoxia: Secondary | ICD-10-CM | POA: Diagnosis present

## 2019-05-31 DIAGNOSIS — E860 Dehydration: Secondary | ICD-10-CM | POA: Diagnosis not present

## 2019-05-31 DIAGNOSIS — E785 Hyperlipidemia, unspecified: Secondary | ICD-10-CM | POA: Diagnosis present

## 2019-05-31 DIAGNOSIS — R8271 Bacteriuria: Secondary | ICD-10-CM | POA: Diagnosis not present

## 2019-05-31 DIAGNOSIS — E872 Acidosis: Secondary | ICD-10-CM | POA: Diagnosis not present

## 2019-05-31 DIAGNOSIS — Z975 Presence of (intrauterine) contraceptive device: Secondary | ICD-10-CM | POA: Diagnosis not present

## 2019-05-31 DIAGNOSIS — Z833 Family history of diabetes mellitus: Secondary | ICD-10-CM

## 2019-05-31 DIAGNOSIS — Z209 Contact with and (suspected) exposure to unspecified communicable disease: Secondary | ICD-10-CM | POA: Diagnosis not present

## 2019-05-31 DIAGNOSIS — J1289 Other viral pneumonia: Secondary | ICD-10-CM | POA: Diagnosis not present

## 2019-05-31 DIAGNOSIS — R0902 Hypoxemia: Secondary | ICD-10-CM | POA: Diagnosis not present

## 2019-05-31 DIAGNOSIS — R05 Cough: Secondary | ICD-10-CM | POA: Diagnosis not present

## 2019-05-31 DIAGNOSIS — R000 Tachycardia, unspecified: Secondary | ICD-10-CM | POA: Diagnosis not present

## 2019-05-31 HISTORY — DX: COVID-19: U07.1

## 2019-05-31 LAB — COMPREHENSIVE METABOLIC PANEL
ALT: 39 U/L (ref 0–44)
AST: 41 U/L (ref 15–41)
Albumin: 3.7 g/dL (ref 3.5–5.0)
Alkaline Phosphatase: 49 U/L (ref 38–126)
Anion gap: 14 (ref 5–15)
BUN: 11 mg/dL (ref 6–20)
CO2: 21 mmol/L — ABNORMAL LOW (ref 22–32)
Calcium: 9.1 mg/dL (ref 8.9–10.3)
Chloride: 101 mmol/L (ref 98–111)
Creatinine, Ser: 1.02 mg/dL — ABNORMAL HIGH (ref 0.44–1.00)
GFR calc Af Amer: >60 mL/min (ref >60)
GFR calc non Af Amer: >60 mL/min (ref >60)
Glucose, Bld: 135 mg/dL — ABNORMAL HIGH (ref 70–99)
Potassium: 3.5 mmol/L (ref 3.5–5.1)
Sodium: 136 mmol/L (ref 135–145)
Total Bilirubin: 1.1 mg/dL (ref 0.3–1.2)
Total Protein: 7.5 g/dL (ref 6.5–8.1)

## 2019-05-31 LAB — CBC WITH DIFFERENTIAL/PLATELET
Abs Immature Granulocytes: 0.02 10*3/uL (ref 0.00–0.07)
Basophils Absolute: 0 10*3/uL (ref 0.0–0.1)
Basophils Relative: 0 %
Eosinophils Absolute: 0 10*3/uL (ref 0.0–0.5)
Eosinophils Relative: 0 %
HCT: 42.8 % (ref 36.0–46.0)
Hemoglobin: 15 g/dL (ref 12.0–15.0)
Immature Granulocytes: 0 %
Lymphocytes Relative: 13 %
Lymphs Abs: 0.7 10*3/uL (ref 0.7–4.0)
MCH: 31.4 pg (ref 26.0–34.0)
MCHC: 35 g/dL (ref 30.0–36.0)
MCV: 89.5 fL (ref 80.0–100.0)
Monocytes Absolute: 0.4 10*3/uL (ref 0.1–1.0)
Monocytes Relative: 7 %
Neutro Abs: 4.6 10*3/uL (ref 1.7–7.7)
Neutrophils Relative %: 80 %
Platelets: 144 10*3/uL — ABNORMAL LOW (ref 150–400)
RBC: 4.78 MIL/uL (ref 3.87–5.11)
RDW: 11.9 % (ref 11.5–15.5)
WBC: 5.7 10*3/uL (ref 4.0–10.5)
nRBC: 0 % (ref 0.0–0.2)

## 2019-05-31 LAB — D-DIMER, QUANTITATIVE: D-Dimer, Quant: 0.92 ug/mL-FEU — ABNORMAL HIGH (ref 0.00–0.50)

## 2019-05-31 LAB — LACTATE DEHYDROGENASE: LDH: 238 U/L — ABNORMAL HIGH (ref 98–192)

## 2019-05-31 LAB — NOVEL CORONAVIRUS, NAA: SARS-CoV-2, NAA: DETECTED — AB

## 2019-05-31 LAB — FIBRINOGEN: Fibrinogen: 642 mg/dL — ABNORMAL HIGH (ref 210–475)

## 2019-05-31 LAB — FERRITIN: Ferritin: 1131 ng/mL — ABNORMAL HIGH (ref 11–307)

## 2019-05-31 LAB — PROCALCITONIN: Procalcitonin: 0.26 ng/mL

## 2019-05-31 LAB — TRIGLYCERIDES: Triglycerides: 117 mg/dL (ref <150)

## 2019-05-31 LAB — LACTIC ACID, PLASMA: Lactic Acid, Venous: 2.2 mmol/L (ref 0.5–1.9)

## 2019-05-31 LAB — C-REACTIVE PROTEIN: CRP: 7.9 mg/dL — ABNORMAL HIGH (ref <1.0)

## 2019-05-31 MED ORDER — ACETAMINOPHEN 325 MG PO TABS
650.0000 mg | ORAL_TABLET | Freq: Four times a day (QID) | ORAL | Status: DC | PRN
  Administered 2019-06-01 (×2): 650 mg via ORAL
  Filled 2019-05-31 (×2): qty 2

## 2019-05-31 MED ORDER — ACETAMINOPHEN 650 MG RE SUPP: 650.0000 mg | Freq: Four times a day (QID) | RECTAL | Status: DC | PRN

## 2019-05-31 MED ORDER — ATORVASTATIN CALCIUM 40 MG PO TABS
40.0000 mg | ORAL_TABLET | Freq: Every day | ORAL | Status: DC
  Administered 2019-06-01 – 2019-06-03 (×3): 40 mg via ORAL
  Filled 2019-05-31 (×3): qty 1

## 2019-05-31 MED ORDER — DEXAMETHASONE SODIUM PHOSPHATE 10 MG/ML IJ SOLN
6.0000 mg | INTRAMUSCULAR | Status: DC
  Administered 2019-06-01 – 2019-06-03 (×3): 6 mg via INTRAVENOUS
  Filled 2019-05-31 (×3): qty 1

## 2019-05-31 MED ORDER — SODIUM CHLORIDE 0.9 % IV SOLN
2.0000 g | INTRAVENOUS | Status: DC
  Administered 2019-05-31: 2 g via INTRAVENOUS
  Filled 2019-05-31: qty 20

## 2019-05-31 MED ORDER — SODIUM CHLORIDE 0.9 % IV SOLN
500.0000 mg | INTRAVENOUS | Status: DC
  Administered 2019-05-31: 500 mg via INTRAVENOUS
  Filled 2019-05-31: qty 500

## 2019-05-31 MED ORDER — SODIUM CHLORIDE 0.9 % IV SOLN: 100.0000 mg | Freq: Every day | INTRAVENOUS | Status: DC

## 2019-05-31 MED ORDER — SODIUM CHLORIDE 0.9 % IV BOLUS (SEPSIS)
500.0000 mL | Freq: Once | INTRAVENOUS | Status: AC
  Administered 2019-05-31: 500 mL via INTRAVENOUS

## 2019-05-31 MED ORDER — ENOXAPARIN SODIUM 40 MG/0.4ML ~~LOC~~ SOLN
40.0000 mg | Freq: Every day | SUBCUTANEOUS | Status: DC
  Administered 2019-06-01 – 2019-06-03 (×4): 40 mg via SUBCUTANEOUS
  Filled 2019-05-31 (×4): qty 0.4

## 2019-05-31 MED ORDER — DEXAMETHASONE SODIUM PHOSPHATE 10 MG/ML IJ SOLN
6.0000 mg | Freq: Once | INTRAMUSCULAR | Status: AC
  Administered 2019-05-31: 6 mg via INTRAVENOUS
  Filled 2019-05-31: qty 1

## 2019-05-31 MED ORDER — SODIUM CHLORIDE 0.9 % IV SOLN
200.0000 mg | Freq: Once | INTRAVENOUS | Status: DC
  Filled 2019-05-31: qty 40

## 2019-05-31 MED ORDER — ONDANSETRON HCL 4 MG PO TABS: 4.0000 mg | ORAL_TABLET | Freq: Four times a day (QID) | ORAL | Status: DC | PRN

## 2019-05-31 MED ORDER — SODIUM CHLORIDE 0.9 % IV SOLN
200.0000 mg | Freq: Once | INTRAVENOUS | Status: AC
  Administered 2019-06-01: 200 mg via INTRAVENOUS
  Filled 2019-05-31: qty 40

## 2019-05-31 MED ORDER — SODIUM CHLORIDE 0.9 % IV SOLN
100.0000 mg | Freq: Every day | INTRAVENOUS | Status: AC
  Administered 2019-06-01 – 2019-06-04 (×4): 100 mg via INTRAVENOUS
  Filled 2019-05-31: qty 20
  Filled 2019-05-31 (×2): qty 100
  Filled 2019-05-31: qty 20

## 2019-05-31 MED ORDER — ONDANSETRON HCL 4 MG/2ML IJ SOLN: 4.0000 mg | Freq: Four times a day (QID) | INTRAMUSCULAR | Status: DC | PRN

## 2019-05-31 NOTE — ED Triage Notes (Addendum)
Pt to triage via [facility]> Pt was tested for COVID 3 days ago but didn't have results.  + results per Epic.  C/o fever, body aches, cough, and SOB since 12/9.  Tylenol 650mg   2 hours ago and Ibuprofen 800 mg just PTA.

## 2019-05-31 NOTE — ED Notes (Signed)
Unsuccessful blood draw attempt. 

## 2019-05-31 NOTE — ED Provider Notes (Addendum)
[facility] Provider Note   CSN: [id number] Arrival date & time: 05/31/19  1800     History Chief Complaint  Patient presents with  . COVID +  . Shortness of Breath    [facility] is a 43 y.o. female patient arrives via EMS today for generalized weakness, body aches, fever, cough and shortness of breath x4 days.  Patient Covid positive via outpatient test.  She reports fevers up to 102 F at home.  Patient describes generalized weakness and fatigue and reports she is unable to get up and move around her bedroom at home due to the symptoms.  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  Patient took 650 mg Tylenol 2 hours prior to arrival and was given 800 mg ibuprofen just prior to arrival.  Patient denies any headache, vision changes, neck stiffness, pleurisy, chest pain, hemoptysis, extremity swelling/color change, history of blood clot, abdominal pain, nausea/vomiting, diarrhea, dysuria/hematuria or additional concerns.  HPI     Past Medical History:  Diagnosis Date  . Abnormal cholesterol test   . Body mass index 32.0-32.9, adult   . Decreased libido   . Follicular cyst of ovary   . History of migraine headaches   . Lump or mass in breast   . Obese   . Surveillance of intrauterine contraceptive device     Patient Active Problem List   Diagnosis Date Noted  . HTN (hypertension) 10/06/2015  . Hyperlipidemia 10/06/2015    Past Surgical History:  Procedure Laterality Date  . NO PAST SURGERIES       OB History   No obstetric history on file.     Family History  Problem Relation Age of Onset  . Kidney Stones Mother   . Hypertension Mother   . Stroke Maternal Grandfather   . Diabetes Maternal Grandfather   . Cancer Maternal Grandmother        BREAST  . Cancer Father        MYLOEISARCOMA PERINEUM    Social History   Tobacco Use  . Smoking status: Never Smoker  .  Smokeless tobacco: Never Used  Substance Use Topics  . Alcohol use: Yes    Alcohol/week: 0.0 standard drinks    Comment: occassionally  . Drug use: No    Home Medications Prior to Admission medications   Medication Sig Start Date End Date Taking? Authorizing Provider  atorvastatin (LIPITOR) 40 MG tablet Take 1 tablet (40 mg total) by mouth daily. 07/28/18  Yes Nahser, Wonda Cheng, MD  levonorgestrel (MIRENA) 20 MCG/24HR IUD 1 each by Intrauterine route once.   Yes [provider]    Allergies    Patient has no known allergies.  Review of Systems   Review of Systems Ten systems are reviewed and are negative for acute change except as noted in the HPI  Physical Exam Updated Vital Signs BP 116/76   Pulse (!) 106   Temp 99 F (37.2 C) (Oral)   Resp (!) 21   SpO2 91%   Physical Exam Constitutional:      General: She is not in acute distress.    Appearance: Normal appearance. She is well-developed. She is obese. She is ill-appearing. She is not diaphoretic.  HENT:     Head: Normocephalic and atraumatic.     Right Ear: External ear normal.     Left Ear: External ear normal.     Nose: Nose normal.  Eyes:     General: Vision grossly intact. Gaze aligned appropriately.     Pupils: Pupils are equal, round, and reactive to light.  Neck:     Trachea: Trachea and phonation normal. No tracheal deviation.  Cardiovascular:     Rate and Rhythm: Regular rhythm. Tachycardia present.  Pulmonary:     Effort: Pulmonary effort is normal. Tachypnea present. No respiratory distress.     Breath sounds: Decreased breath sounds present.  Chest:     Chest wall: No deformity, tenderness or crepitus.  Abdominal:     General: There is no distension.     Palpations: Abdomen is soft.     Tenderness: There is no abdominal tenderness. There is no guarding or rebound.  Musculoskeletal:        General: Normal range of motion.     Cervical back: Normal range of motion.     Right lower leg: No  tenderness. No edema.     Left lower leg: No tenderness. No edema.  Skin:    General: Skin is warm and dry.  Neurological:     Mental Status: She is alert.     GCS: GCS eye subscore is 4. GCS verbal subscore is 5. GCS motor subscore is 6.     Comments: Speech is clear and goal oriented, follows commands Major Cranial nerves without deficit, no facial droop Moves extremities without ataxia, coordination intact  Psychiatric:        Behavior: Behavior normal.     ED Results / Procedures / Treatments   Labs (all labs ordered are listed, but only abnormal results are displayed) Labs Reviewed  LACTIC ACID, PLASMA - Abnormal; Notable for the following components:      Result Value   Lactic Acid, Venous 2.2 (*)    All other components within normal limits  CBC WITH DIFFERENTIAL/PLATELET - Abnormal; Notable for the following components:   Platelets 144 (*)    All other components within normal limits  COMPREHENSIVE METABOLIC PANEL - Abnormal; Notable for the following components:   CO2 21 (*)    Glucose, Bld 135 (*)    Creatinine, Ser 1.02 (*)    All other components within normal limits  D-DIMER, QUANTITATIVE (NOT AT [facility]) - Abnormal; Notable for the following components:   D-Dimer, Quant 0.92 (*)    All other components within normal limits  LACTATE DEHYDROGENASE - Abnormal; Notable for the following components:   LDH 238 (*)    All other components within normal limits  FERRITIN - Abnormal; Notable for the following components:   Ferritin 1,131 (*)    All other components within normal limits  FIBRINOGEN - Abnormal; Notable for the following components:   Fibrinogen 642 (*)    All other components within normal limits  C-REACTIVE PROTEIN - Abnormal; Notable for the following components:   CRP 7.9 (*)    All other components within normal limits  CULTURE, BLOOD (ROUTINE X 2)  CULTURE, BLOOD (ROUTINE X 2)  URINE CULTURE  TRIGLYCERIDES  LACTIC ACID, PLASMA  PROCALCITONIN    URINALYSIS, ROUTINE W REFLEX MICROSCOPIC  I-STAT BETA HCG BLOOD, ED ([facility], [facility], [facility] ONLY)    EKG EKG Interpretation  Date/Time:  Sunday May 31 2019 18:22:57 EST Ventricular Rate:  122 PR Interval:    QRS Duration: 88 QT Interval:  304 QTC Calculation: 433 R Axis:   113 Text Interpretation: Sinus tachycardia Multiform ventricular premature complexes Aberrant conduction of SV complex(es) Right axis deviation Baseline wander in lead(s)  II III aVF V1 V2 No old tracing to compare Confirmed by Rolan Bucco [phone] on 05/31/2019 9:55:10 PM   Radiology DG Chest Port 1 View  Result Date: 05/31/2019 CLINICAL DATA:  Pt to triage via [facility]> Pt was tested for COVID 3 days ago but didn't have results. + results per Epic. C/o fever, body aches, cough, and SOB since 12/9. EXAM: PORTABLE CHEST 1 VIEW COMPARISON:  None. FINDINGS: The cardiomediastinal contours are within normal limits for AP technique. Low lung volumes. There are scattered linear opacities in the bilateral lung bases likely atelectasis. No pneumothorax or large pleural effusion. No acute finding in the visualized skeleton. IMPRESSION: Scattered linear opacities in the bilateral lung bases likely atelectasis. Electronically Signed   By: Emmaline Kluver M.D.   On: 05/31/2019 20:27    Procedures .Critical Care Performed by: Bill Salinas, PA-C Authorized by: Bill Salinas, PA-C   Critical care provider statement:    Critical care time (minutes):  32   Critical care was necessary to treat or prevent imminent or life-threatening deterioration of the following conditions:  Respiratory failure (COVID-19 viral infection with hypoxia requiring supplemental oxygen.)   Critical care was time spent personally by me on the following activities:  Discussions with consultants, evaluation of patient's response to treatment, examination of patient, ordering and performing treatments and interventions, ordering and review of laboratory  studies, ordering and review of radiographic studies, pulse oximetry, re-evaluation of patient's condition, obtaining history from patient or surrogate, review of old charts and development of treatment plan with patient or surrogate   (including critical care time)  Medications Ordered in ED Medications  cefTRIAXone (ROCEPHIN) 2 g in sodium chloride 0.9 % 100 mL IVPB (0 g Intravenous Stopped 05/31/19 1959)  azithromycin (ZITHROMAX) 500 mg in sodium chloride 0.9 % 250 mL IVPB (500 mg Intravenous New Bag/Given 05/31/19 2001)  dexamethasone (DECADRON) injection 6 mg (has no administration in time range)  sodium chloride 0.9 % bolus 500 mL (500 mLs Intravenous New Bag/Given 05/31/19 1906)    ED Course  I have reviewed the triage vital signs and the nursing notes.  Pertinent labs & imaging results that were available during my care of the patient were reviewed by me and considered in my medical decision making (see chart for details).  Clinical Course as of May 30 2140  Sun May 31, 2019  2052 Dr. Toniann Fail   [BM]    Clinical Course User Index [BM] Elizabeth Palau   MDM Rules/Calculators/A&P    43 year old female arrives in her fourth day of illness positive for COVID-19 virus.  She endorses shortness of breath, cough, body aches, generalized weakness and generalized fatigue.  No focal weakness.  On examination she is ill-appearing, unable to ambulate around room due to weakness.  Tachycardic heart rate 150 bpm on monitor, reports chest pain-free, no hypotension.  Her saturation during my examination is 98-91% on room air.  She has been placed on 2 L nasal cannula.  Patient will need admission today for COVID-19 viral infection and hypoxia requiring oxygen requirement.  Covid order set utilized.  Case discussed with Dr. Fredderick Phenix, as patient with fever, tachycardia and tachypnea she does meet SIRS criteria however suspect this is secondary to her viral Covid infection today, we will begin  Antibiotics for coverage of possible bacterial pneumonia as etiology of symptoms however will avoid the full 30 mL/kg fluid bolus in the setting of Covid infection. - EKG: Sinus tachycardia Multiform ventricular premature complexes Aberrant  conduction of SV complex(es) Right axis deviation Baseline wander in lead(s) II III aVF V1 V2 No old tracing to compare Confirmed by Rolan BuccoBelfi, Melanie [phone] on 05/31/2019 9:55:10 PM  CBC thrombocytopenia 144 otherwise within normal limits CMP nonacute Fibrinogen 642 D-dimer 0.92 Lactic 2.2 LDH 234 Ferritin 1131 Triglycerides within normal limits CRP 7.9 CXR:  IMPRESSION:  Scattered linear opacities in the bilateral lung bases likely  atelectasis.  - Patient reassessed, improved appearing on supplemental oxygen and after fluid bolus.  States understanding of care plan and need for admission.  Suspect laboratory anomalies including elevated D-dimer today secondary to COVID-19 infection.  She denies any chest pain or pleurisy low suspicion for PE, ACS or other acute cardiopulmonary etiology of her symptoms today. - 8:52 PM: Discussed case with Dr. Toniann FailKakrakandy from hospitalist service will be seeing patient for admission. - Patient has been admitted to hospitalist service for further evaluation management.  [facility] was evaluated in Emergency Department on 05/31/2019 for the symptoms described in the history of present illness. She was evaluated in the context of the global COVID-19 pandemic, which necessitated consideration that the patient might be at risk for infection with the SARS-CoV-2 virus that causes COVID-19. Institutional protocols and algorithms that pertain to the evaluation of patients at risk for COVID-19 are in a state of rapid change based on information released by regulatory bodies including the CDC and federal and state organizations. These policies and algorithms were followed during the patient's care in the ED.   Note: Portions  of this report may have been transcribed using voice recognition software. Every effort was made to ensure accuracy; however, inadvertent computerized transcription errors may still be present. Final Clinical Impression(s) / ED Diagnoses Final diagnoses:  COVID-19 virus infection  Hypoxia    Rx / DC Orders ED Discharge Orders    None       Elizabeth PalauMorelli, Zaineb Nowaczyk A, PA-C 05/31/19 2156    Rolan BuccoBelfi, Melanie, MD 05/31/19 2237

## 2019-05-31 NOTE — H&P (Signed)
History and Physical    [facility] NightingaleKimberly S Sukup WJX:[id number] DOB: 03-14-1976 DOA: 05/31/2019  PCP: Merlene LaughterStoneking, Hal, MD  Patient coming from: Home.  Chief Complaint: Fever chills and body aches.  HPI: [facility] is a 43 y.o. female with history of hyperlipidemia presents to the ER with complaint of persistent generalized body ache with fever and chills.  Has had no nausea vomiting or diarrhea or denies any chest pain or shortness of breath.  Has been a nonproductive cough.  ED Course: In the ER patient was found to be tachycardic with fever 100.8.  Labs show mildly elevated creatinine from baseline of 1.02 COVID-19 test was positive.  CRP was 1.9 ferritin was 1100 platelets 144.  Procalcitonin 0.2 lactic chest x-ray unremarkable lactic acid initially was 2.2 improved with fluids to 1.5.  Patient admitted for SIRS secondary to COVID-19 pneumonia.  Requires 2 L oxygen.  Review of Systems: As per HPI, rest all negative.   Past Medical History:  Diagnosis Date  . Abnormal cholesterol test   . Body mass index 32.0-32.9, adult   . Decreased libido   . Follicular cyst of ovary   . History of migraine headaches   . Lump or mass in breast   . Obese   . Surveillance of intrauterine contraceptive device     Past Surgical History:  Procedure Laterality Date  . NO PAST SURGERIES       reports that she has never smoked. She has never used smokeless tobacco. She reports current alcohol use. She reports that she does not use drugs.  No Known Allergies  Family History  Problem Relation Age of Onset  . Kidney Stones Mother   . Hypertension Mother   . Stroke Maternal Grandfather   . Diabetes Maternal Grandfather   . Cancer Maternal Grandmother        BREAST  . Cancer Father        Berle MullMYLOEISARCOMA PERINEUM    Prior to Admission medications   Medication Sig Start Date End Date Taking? Authorizing Provider  atorvastatin (LIPITOR) 40 MG tablet Take 1 tablet (40 mg total) by mouth daily.  07/28/18  Yes Nahser, Deloris PingPhilip J, MD  levonorgestrel (MIRENA) 20 MCG/24HR IUD 1 each by Intrauterine route once.   Yes [provider]    Physical Exam: Constitutional: Moderately built and nourished. Vitals:   05/31/19 1916 05/31/19 1916 05/31/19 2000 05/31/19 2110  BP:   116/76   Pulse:   (!) 106   Resp:   (!) 21   Temp: 99 F (37.2 C)     TempSrc: Oral     SpO2:  97% 96% 91%   Eyes: Anicteric no pallor. ENMT: No discharge from the ears eyes nose or mouth. Neck: No mass felt.  No neck rigidity. Respiratory: No rhonchi or crepitations. Cardiovascular: S1-S2 heard. Abdomen: Soft nontender bowel sounds present. Musculoskeletal: No edema.  No joint effusion. Skin: No rash. Neurologic: Alert awake oriented to time place and person.  Moves all extremities. Psychiatric: Appears normal per normal affect.   Labs on Admission: I have personally reviewed following labs and imaging studies  CBC: Recent Labs  Lab 05/31/19 1909  WBC 5.7  NEUTROABS 4.6  HGB 15.0  HCT 42.8  MCV 89.5  PLT 144*   Basic Metabolic Panel: Recent Labs  Lab 05/31/19 1909  NA 136  K 3.5  CL 101  CO2 21*  GLUCOSE 135*  BUN 11  CREATININE 1.02*  CALCIUM 9.1   GFR: CrCl  cannot be calculated (Unknown ideal weight.). Liver Function Tests: Recent Labs  Lab 05/31/19 1909  AST 41  ALT 39  ALKPHOS 49  BILITOT 1.1  PROT 7.5  ALBUMIN 3.7   No results for input(s): LIPASE, AMYLASE in the last 168 hours. No results for input(s): AMMONIA in the last 168 hours. Coagulation Profile: No results for input(s): INR, PROTIME in the last 168 hours. Cardiac Enzymes: No results for input(s): CKTOTAL, CKMB, CKMBINDEX, TROPONINI in the last 168 hours. BNP (last 3 results) No results for input(s): PROBNP in the last 8760 hours. HbA1C: No results for input(s): HGBA1C in the last 72 hours. CBG: No results for input(s): GLUCAP in the last 168 hours. Lipid Profile: Recent Labs    05/31/19 1909   TRIG 117   Thyroid Function Tests: No results for input(s): TSH, T4TOTAL, FREET4, T3FREE, THYROIDAB in the last 72 hours. Anemia Panel: Recent Labs    05/31/19 1909  FERRITIN 1,131*   Urine analysis: No results found for: COLORURINE, APPEARANCEUR, LABSPEC, PHURINE, GLUCOSEU, HGBUR, BILIRUBINUR, KETONESUR, PROTEINUR, UROBILINOGEN, NITRITE, LEUKOCYTESUR Sepsis Labs: @LABRCNTIP (procalcitonin:4,lacticidven:4) ) Recent Results (from the past 240 hour(s))  Novel Coronavirus, NAA ([facility])     Status: Abnormal   Collection Time: 05/28/19 12:00 AM   Specimen: Nasopharyngeal(NP) swabs in vial transport medium   NASOPHARYNGE  TESTING  Result Value Ref Range Status   SARS-CoV-2, NAA Detected (A) Not Detected Final    Comment: This nucleic acid amplification test was developed and its performance characteristics determined by [employer]. Nucleic acid amplification tests include PCR and TMA. This test has not been FDA cleared or approved. This test has been authorized by FDA under an Emergency Use Authorization (EUA). This test is only authorized for the duration of time the declaration that circumstances exist justifying the authorization of the emergency use of in vitro diagnostic tests for detection of SARS-CoV-2 virus and/or diagnosis of COVID-19 infection under section 564(b)(1) of the Act, 21 U.S.C. 188CZY-6(A) (1), unless the authorization is terminated or revoked sooner. When diagnostic testing is negative, the possibility of a false negative result should be considered in the context of a patient's recent exposures and the presence of clinical signs and symptoms consistent with COVID-19. An individual without symptoms of COVID-19 and who is not shedding SARS-CoV-2 virus would  expect to have a negative (not detected) result in this assay.      Radiological Exams on Admission: DG Chest Port 1 View  Result Date: 05/31/2019 CLINICAL DATA:  Pt to triage via [facility]> Pt  was tested for COVID 3 days ago but didn't have results. + results per Epic. C/o fever, body aches, cough, and SOB since 12/9. EXAM: PORTABLE CHEST 1 VIEW COMPARISON:  None. FINDINGS: The cardiomediastinal contours are within normal limits for AP technique. Low lung volumes. There are scattered linear opacities in the bilateral lung bases likely atelectasis. No pneumothorax or large pleural effusion. No acute finding in the visualized skeleton. IMPRESSION: Scattered linear opacities in the bilateral lung bases likely atelectasis. Electronically Signed   By: Audie Pinto M.D.   On: 05/31/2019 20:27    EKG: Independently reviewed.  Sinus tachycardia.  Assessment/Plan Principal Problem:   SIRS (systemic inflammatory response syndrome) (HCC) Active Problems:   Hyperlipidemia   COVID-19 virus infection   ARF (acute renal failure) (HCC)   Thrombocytopenia (HCC)    1. SIRS secondary to COVID-19 pneumonia for which I have ordered IV remdesivir and Decadron.  Closely monitor for any worsening of respiratory status.  Presently on 2 L oxygen.  Follow inflammatory markers. 2. Acute renal failure likely from dehydration.  Patient did receive fluids in the ER.  Follow metabolic panel. 3. Hyperlipidemia on statins. 4. Thrombocytopenia likely from infectious process.  Follow CBC closely.  Given that patient has SIRS picture with COVID-19 pneumonia will need close monitoring and more than 2 midnight stay in inpatient status.   DVT prophylaxis: Lovenox. Code Status: Full code. Family Communication: Discussed with patient. Disposition Plan: Home. Consults called: None. Admission status: Inpatient.   Eduard Clos MD [facility] Pager [phone].  If 7PM-7AM, please contact night-coverage [email or web address] Password [facility]  05/31/2019, 10:39 PM

## 2019-05-31 NOTE — ED Notes (Addendum)
Pt O2 97% on 2L, 91% on room air. PA notified.

## 2019-06-01 DIAGNOSIS — R6510 Systemic inflammatory response syndrome (SIRS) of non-infectious origin without acute organ dysfunction: Secondary | ICD-10-CM

## 2019-06-01 LAB — CBC WITH DIFFERENTIAL/PLATELET
Abs Immature Granulocytes: 0.03 10*3/uL (ref 0.00–0.07)
Basophils Absolute: 0 10*3/uL (ref 0.0–0.1)
Basophils Relative: 0 %
Eosinophils Absolute: 0 10*3/uL (ref 0.0–0.5)
Eosinophils Relative: 0 %
HCT: 44.5 % (ref 36.0–46.0)
Hemoglobin: 15.1 g/dL — ABNORMAL HIGH (ref 12.0–15.0)
Immature Granulocytes: 0 %
Lymphocytes Relative: 7 %
Lymphs Abs: 0.5 10*3/uL — ABNORMAL LOW (ref 0.7–4.0)
MCH: 31.2 pg (ref 26.0–34.0)
MCHC: 33.9 g/dL (ref 30.0–36.0)
MCV: 91.9 fL (ref 80.0–100.0)
Monocytes Absolute: 0.3 10*3/uL (ref 0.1–1.0)
Monocytes Relative: 4 %
Neutro Abs: 6.3 10*3/uL (ref 1.7–7.7)
Neutrophils Relative %: 89 %
Platelets: 156 10*3/uL (ref 150–400)
RBC: 4.84 MIL/uL (ref 3.87–5.11)
RDW: 12.1 % (ref 11.5–15.5)
WBC: 7.1 10*3/uL (ref 4.0–10.5)
nRBC: 0 % (ref 0.0–0.2)

## 2019-06-01 LAB — URINALYSIS, ROUTINE W REFLEX MICROSCOPIC
Bilirubin Urine: NEGATIVE
Glucose, UA: NEGATIVE mg/dL
Hgb urine dipstick: NEGATIVE
Ketones, ur: NEGATIVE mg/dL
Leukocytes,Ua: NEGATIVE
Nitrite: NEGATIVE
Protein, ur: NEGATIVE mg/dL
Specific Gravity, Urine: 1.012 (ref 1.005–1.030)
pH: 5 (ref 5.0–8.0)

## 2019-06-01 LAB — COMPREHENSIVE METABOLIC PANEL
ALT: 37 U/L (ref 0–44)
AST: 38 U/L (ref 15–41)
Albumin: 3.6 g/dL (ref 3.5–5.0)
Alkaline Phosphatase: 47 U/L (ref 38–126)
Anion gap: 13 (ref 5–15)
BUN: 10 mg/dL (ref 6–20)
CO2: 24 mmol/L (ref 22–32)
Calcium: 9.2 mg/dL (ref 8.9–10.3)
Chloride: 103 mmol/L (ref 98–111)
Creatinine, Ser: 0.87 mg/dL (ref 0.44–1.00)
GFR calc Af Amer: >60 mL/min (ref >60)
GFR calc non Af Amer: >60 mL/min (ref >60)
Glucose, Bld: 180 mg/dL — ABNORMAL HIGH (ref 70–99)
Potassium: 4.4 mmol/L (ref 3.5–5.1)
Sodium: 140 mmol/L (ref 135–145)
Total Bilirubin: 0.6 mg/dL (ref 0.3–1.2)
Total Protein: 7.3 g/dL (ref 6.5–8.1)

## 2019-06-01 LAB — FERRITIN: Ferritin: 995 ng/mL — ABNORMAL HIGH (ref 11–307)

## 2019-06-01 LAB — I-STAT BETA HCG BLOOD, ED (MC, WL, AP ONLY): I-stat hCG, quantitative: <5 m[IU]/mL (ref <5)

## 2019-06-01 LAB — HIV ANTIBODY (ROUTINE TESTING W REFLEX): HIV Screen 4th Generation wRfx: NONREACTIVE

## 2019-06-01 LAB — LACTIC ACID, PLASMA: Lactic Acid, Venous: 1.5 mmol/L (ref 0.5–1.9)

## 2019-06-01 LAB — ABO/RH: ABO/RH(D): A POS

## 2019-06-01 LAB — C-REACTIVE PROTEIN: CRP: 12.6 mg/dL — ABNORMAL HIGH (ref <1.0)

## 2019-06-01 NOTE — Progress Notes (Signed)
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  Associated with fevers and chills.  Denies any cardiac or GI symptoms.  Admits to intermittent nonproductive cough.  Outpatient COVID-19 test positive on 05/28/2019.  Admitted for COVID-19 viral infection.  Initially requiring 2 L of oxygen by nasal cannula, now on room air.  Started on remdesivir and Decadron on admission, continue.  06/01/19: Seen and examined.  Reports persistent generalized weakness with a poor appetite and reduced oral intake.  Denies diarrhea or abdominal pain.  No chest pain or extremity tenderness.  Assessment/Plan: Principal Problem:   SIRS (systemic inflammatory response syndrome) (HCC) Active Problems:   Hyperlipidemia   COVID-19 virus infection   ARF (acute renal failure) (HCC)   Thrombocytopenia (HCC)  SIRS secondary to COVID-19 pneumonia Positive outpatient COVID-19 on 05/28/2019 Started on IV remdesivir on Decadron admission Day #2/5 Of remdesivir and day number 2 out of 10 of IV Decadron 6 mg daily Initially requiring 2 L Now on room air Personally reviewed chest x-ray which showed scattered linear opacities worse at the lung bases left greater than right. Procalcitonin -0.26 Inflammatory markers are elevated Continue to trend inflammatory markers Urine culture taken on 05/31/2019 in process Blood cultures taken on 05/31/2019 x2 - to date  Generalized weakness likely in the setting of acute COVID-19 viral infection PT OT to assess Encourage oral protein calorie intake Monitor vital signs and inflammatory markers   Hyperlipidemia LDL elevated Continue statin  Obesity BMI 33 Recommend weight loss outpatient  with regular physical activity and healthy dieting post COVID-19 viral pneumonia treatment.  Resolved mild endolymphatic public acidosis Presented with chemistry bicarb of 21 and anion gap of 14 This has resolved  Resolved thrombocytopenia likely in the setting of acute viral illness.   DVT prophylaxis: Lovenox subcu daily Code Status: Full code. Family Communication: None at bedside. Disposition Plan:  Patient is currently not appropriate for discharge at this time due to ongoing treatment for COVID-19 viral pneumonia.  Patient will require at least 2 midnights for further evaluation and treatment of present condition.  Consults called: None.   Objective: Vitals:   06/01/19 1045 06/01/19 1100 06/01/19 1115 06/01/19 1130  BP: 120/73 118/78 124/81 130/83  Pulse: (!) 104 (!) 127 (!) 125 (!) 107  Resp: (!) 22 (!) 21 (!) 29 (!) 24  Temp:      TempSrc:      SpO2: 92% 91% 93% 95%  Weight:      Height:       No intake or output data in the 24 hours ending 06/01/19 1133 Filed Weights   06/01/19 0907  Weight: 109.8 kg    Exam:  . General: 43 y.o. year-old female well developed well nourished in no acute distress.  Alert and oriented x3.  Appears uncomfortable due to generalized weakness. . Cardiovascular: Regular rate and rhythm with no rubs or gallops.  No thyromegaly or JVD noted.   Marland Kitchen Respiratory: Mild rales at bases.  No wheezing noted.  Poor inspiratory effort. . Abdomen: Soft nontender nondistended with normal bowel sounds x4 quadrants. . Musculoskeletal: No lower extremity edema. 2/4 pulses in all 4 extremities. Marland Kitchen Psychiatry: Mood is appropriate for condition and setting   Data Reviewed: CBC: Recent Labs  Lab 05/31/19  1909 06/01/19 0602  WBC 5.7 7.1  NEUTROABS 4.6 6.3  HGB 15.0 15.1*  HCT 42.8 44.5  MCV 89.5 91.9  PLT 144* 156   Basic Metabolic Panel: Recent Labs  Lab 05/31/19 1909 06/01/19 0602  NA 136 140  K 3.5 4.4  CL 101 103  CO2 21* 24  GLUCOSE  135* 180*  BUN 11 10  CREATININE 1.02* 0.87  CALCIUM 9.1 9.2   GFR: Estimated Creatinine Clearance: 111.4 mL/min (by C-G formula based on SCr of 0.87 mg/dL). Liver Function Tests: Recent Labs  Lab 05/31/19 1909 06/01/19 0602  AST 41 38  ALT 39 37  ALKPHOS 49 47  BILITOT 1.1 0.6  PROT 7.5 7.3  ALBUMIN 3.7 3.6   No results for input(s): LIPASE, AMYLASE in the last 168 hours. No results for input(s): AMMONIA in the last 168 hours. Coagulation Profile: No results for input(s): INR, PROTIME in the last 168 hours. Cardiac Enzymes: No results for input(s): CKTOTAL, CKMB, CKMBINDEX, TROPONINI in the last 168 hours. BNP (last 3 results) No results for input(s): PROBNP in the last 8760 hours. HbA1C: No results for input(s): HGBA1C in the last 72 hours. CBG: No results for input(s): GLUCAP in the last 168 hours. Lipid Profile: Recent Labs    05/31/19 1909  TRIG 117   Thyroid Function Tests: No results for input(s): TSH, T4TOTAL, FREET4, T3FREE, THYROIDAB in the last 72 hours. Anemia Panel: Recent Labs    05/31/19 1909  FERRITIN 1,131*   Urine analysis:    Component Value Date/Time   COLORURINE YELLOW 05/31/2019 2359   APPEARANCEUR CLEAR 05/31/2019 2359   LABSPEC 1.012 05/31/2019 2359   PHURINE 5.0 05/31/2019 2359   GLUCOSEU NEGATIVE 05/31/2019 2359   HGBUR NEGATIVE 05/31/2019 2359   BILIRUBINUR NEGATIVE 05/31/2019 2359   KETONESUR NEGATIVE 05/31/2019 2359   PROTEINUR NEGATIVE 05/31/2019 2359   NITRITE NEGATIVE 05/31/2019 2359   LEUKOCYTESUR NEGATIVE 05/31/2019 2359   Sepsis Labs: @LABRCNTIP (procalcitonin:4,lacticidven:4)  ) Recent Results (from the past 240 hour(s))  Novel Coronavirus, NAA ([facility])     Status: Abnormal   Collection Time: 05/28/19 12:00 AM   Specimen: Nasopharyngeal(NP) swabs in vial transport medium   NASOPHARYNGE  TESTING  Result Value Ref Range Status   SARS-CoV-2, NAA Detected (A) Not Detected Final    Comment: This nucleic acid  amplification test was developed and its performance characteristics determined by [facility]. Nucleic acid amplification tests include PCR and TMA. This test has not been FDA cleared or approved. This test has been authorized by FDA under an Emergency Use Authorization (EUA). This test is only authorized for the duration of time the declaration that circumstances exist justifying the authorization of the emergency use of in vitro diagnostic tests for detection of SARS-CoV-2 virus and/or diagnosis of COVID-19 infection under section 564(b)(1) of the Act, 21 U.S.C. 161WRU-0([id number]-3(b) (1), unless the authorization is terminated or revoked sooner. When diagnostic testing is negative, the possibility of a false negative result should be considered in the context of a patient's recent exposures and the presence of clinical signs and symptoms consistent with COVID-19. An individual without symptoms of COVID-19 and who is not shedding SARS-CoV-2 virus would  expect to have a negative (not detected) result in this assay.   Blood Culture (routine x 2)     Status: None (Preliminary result)   Collection Time: 05/31/19  7:09 PM   Specimen: BLOOD RIGHT HAND  Result Value Ref Range Status   Specimen Description BLOOD RIGHT HAND  Final   Special Requests   Final    BOTTLES DRAWN AEROBIC AND ANAEROBIC Blood Culture results may not be optimal due to an excessive volume of blood received in culture bottles   Culture   Final    NO GROWTH < 12 HOURS Performed at [facility], [address] [place], [place] [address]    Report Status PENDING  Incomplete  Blood Culture (routine x 2)     Status: None (Preliminary result)   Collection Time: 05/31/19 11:55 PM   Specimen: BLOOD  Result Value Ref Range Status   Specimen Description BLOOD RIGHT ANTECUBITAL  Final   Special Requests   Final    BOTTLES DRAWN AEROBIC AND ANAEROBIC Blood Culture results may not be optimal due to an inadequate volume  of blood received in culture bottles   Culture   Final    NO GROWTH < 12 HOURS Performed at [facility], [address] [place], [place] [address]    Report Status PENDING  Incomplete      Studies: DG Chest Port 1 View  Result Date: 05/31/2019 CLINICAL DATA:  Pt to triage via [facility]> Pt was tested for COVID 3 days ago but didn't have results. + results per Epic. C/o fever, body aches, cough, and SOB since 12/9. EXAM: PORTABLE CHEST 1 VIEW COMPARISON:  None. FINDINGS: The cardiomediastinal contours are within normal limits for AP technique. Low lung volumes. There are scattered linear opacities in the bilateral lung bases likely atelectasis. No pneumothorax or large pleural effusion. No acute finding in the visualized skeleton. IMPRESSION: Scattered linear opacities in the bilateral lung bases likely atelectasis. Electronically Signed   By: Emmaline Kluver M.D.   On: 05/31/2019 20:27    Scheduled Meds: . atorvastatin  40 mg Oral q1800  . dexamethasone (DECADRON) injection  6 mg Intravenous Q24H  . enoxaparin (LOVENOX) injection  40 mg Subcutaneous QHS    Continuous Infusions: . remdesivir 100 mg in NS 100 mL       LOS: 1 day     Darlin Drop, MD [facility] Pager [phone]  If 7PM-7AM, please contact night-coverage [email or web address] Password [facility] 06/01/2019, 11:33 AM

## 2019-06-01 NOTE — ED Notes (Signed)
Breakfast Ordered 

## 2019-06-01 NOTE — ED Notes (Signed)
RN rounded on pt. Pt resting comfortably at this time. VSS.

## 2019-06-01 NOTE — ED Notes (Signed)
2 RNs attempted blood draw, unsuccessful. Requested lab to draw labs.

## 2019-06-02 LAB — COMPREHENSIVE METABOLIC PANEL
ALT: 34 U/L (ref 0–44)
AST: 39 U/L (ref 15–41)
Albumin: 3.1 g/dL — ABNORMAL LOW (ref 3.5–5.0)
Alkaline Phosphatase: 35 U/L — ABNORMAL LOW (ref 38–126)
Anion gap: 17 — ABNORMAL HIGH (ref 5–15)
BUN: 18 mg/dL (ref 6–20)
CO2: 18 mmol/L — ABNORMAL LOW (ref 22–32)
Calcium: 8.9 mg/dL (ref 8.9–10.3)
Chloride: 106 mmol/L (ref 98–111)
Creatinine, Ser: 0.76 mg/dL (ref 0.44–1.00)
GFR calc Af Amer: >60 mL/min (ref >60)
GFR calc non Af Amer: >60 mL/min (ref >60)
Glucose, Bld: 186 mg/dL — ABNORMAL HIGH (ref 70–99)
Potassium: 4.7 mmol/L (ref 3.5–5.1)
Sodium: 141 mmol/L (ref 135–145)
Total Bilirubin: 1.1 mg/dL (ref 0.3–1.2)
Total Protein: 6.8 g/dL (ref 6.5–8.1)

## 2019-06-02 LAB — FERRITIN: Ferritin: 986 ng/mL — ABNORMAL HIGH (ref 11–307)

## 2019-06-02 LAB — D-DIMER, QUANTITATIVE: D-Dimer, Quant: 0.46 ug/mL-FEU (ref 0.00–0.50)

## 2019-06-02 LAB — FIBRINOGEN: Fibrinogen: 471 mg/dL (ref 210–475)

## 2019-06-02 LAB — C-REACTIVE PROTEIN: CRP: 9.5 mg/dL — ABNORMAL HIGH (ref <1.0)

## 2019-06-02 LAB — GLUCOSE, CAPILLARY
Glucose-Capillary: 135 mg/dL — ABNORMAL HIGH (ref 70–99)
Glucose-Capillary: 156 mg/dL — ABNORMAL HIGH (ref 70–99)

## 2019-06-02 LAB — HEMOGLOBIN A1C
Hgb A1c MFr Bld: 5.9 % — ABNORMAL HIGH (ref 4.8–5.6)
Mean Plasma Glucose: 122.63 mg/dL

## 2019-06-02 LAB — LACTATE DEHYDROGENASE: LDH: 292 U/L — ABNORMAL HIGH (ref 98–192)

## 2019-06-02 MED ORDER — LEVALBUTEROL TARTRATE 45 MCG/ACT IN AERO
2.0000 | INHALATION_SPRAY | Freq: Three times a day (TID) | RESPIRATORY_TRACT | Status: DC
  Administered 2019-06-02 – 2019-06-04 (×6): 2 via RESPIRATORY_TRACT
  Filled 2019-06-02 (×3): qty 15

## 2019-06-02 MED ORDER — SODIUM BICARBONATE 650 MG PO TABS
1300.0000 mg | ORAL_TABLET | Freq: Two times a day (BID) | ORAL | Status: DC
  Administered 2019-06-02 – 2019-06-03 (×3): 1300 mg via ORAL
  Filled 2019-06-02 (×4): qty 2

## 2019-06-02 MED ORDER — INSULIN ASPART 100 UNIT/ML ~~LOC~~ SOLN
0.0000 [IU] | Freq: Three times a day (TID) | SUBCUTANEOUS | Status: DC
  Administered 2019-06-03: 1 [IU] via SUBCUTANEOUS
  Administered 2019-06-03: 2 [IU] via SUBCUTANEOUS
  Administered 2019-06-03 – 2019-06-04 (×2): 1 [IU] via SUBCUTANEOUS

## 2019-06-02 MED ORDER — IPRATROPIUM BROMIDE HFA 17 MCG/ACT IN AERS
2.0000 | INHALATION_SPRAY | Freq: Three times a day (TID) | RESPIRATORY_TRACT | Status: DC
  Administered 2019-06-02 – 2019-06-04 (×6): 2 via RESPIRATORY_TRACT
  Filled 2019-06-02 (×2): qty 12.9

## 2019-06-02 NOTE — Evaluation (Signed)
Physical Therapy Evaluation Patient Details Name: NGUYET MERCER MRN: [id number] DOB: [birth year] Today's Date: 06/02/2019   History of Present Illness  43 y.o. female with history of hyperlipidemia presents to the ER with complaint of persistent generalized body ache with fever and chills. Patient admitted for SIRS secondary to COVID-19 pneumonia.    Clinical Impression  Pt admitted with above diagnosis. PTA pt independent, working as a Marine scientist in dermatology office. On eval, she required min assist bed mobility, min guard assist sit to stand and min guard assist ambulation 15'. Distance limited by lines. She fatigues quickly. SpO2 91% on 5L during mobility and max HR 120.  Pt currently with functional limitations due to the deficits listed below (see PT Problem List). Pt will benefit from skilled PT to increase their independence and safety with mobility to allow discharge to the venue listed below.       Follow Up Recommendations No PT follow up;Supervision for mobility/OOB    Equipment Recommendations  None recommended by PT    Recommendations for Other Services       Precautions / Restrictions Precautions Precautions: Other (comment) Precaution Comments: watch sats      Mobility  Bed Mobility Overal bed mobility: Needs Assistance Bed Mobility: Supine to Sit;Sit to Supine     Supine to sit: Min assist;HOB elevated Sit to supine: HOB elevated;Min assist   General bed mobility comments: +rail, increased time  Transfers Overall transfer level: Needs assistance Equipment used: None Transfers: Sit to/from Stand Sit to Stand: Min guard            Ambulation/Gait Ambulation/Gait assistance: Min guard Gait Distance (Feet): 15 Feet Assistive device: None Gait Pattern/deviations: Step-through pattern;Decreased stride length     General Gait Details: Distance limited by lines in ED and covid+. Pt on 5L with SpO2 91% during mobility. Max HR 120.  Stairs             Wheelchair Mobility    Modified Rankin (Stroke Patients Only)       Balance Overall balance assessment: No apparent balance deficits (not formally assessed)                                           Pertinent Vitals/Pain Pain Assessment: No/denies pain    Home Living Family/patient expects to be discharged to:: Private residence Living Arrangements: Spouse/significant other;Children Available Help at Discharge: Family;Available 24 hours/day Type of Home: House Home Access: Stairs to enter Entrance Stairs-Rails: Right;Left;Can reach both Entrance Stairs-Number of Steps: 3 Home Layout: Two level;Able to live on main level with bedroom/bathroom Home Equipment: Shower seat      Prior Function Level of Independence: Independent         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 SpO2 94% at rest.    Exercises     Assessment/Plan    PT Assessment Patient needs continued PT services  PT Problem List Decreased mobility;Decreased activity tolerance;Cardiopulmonary status limiting activity       PT Treatment Interventions Therapeutic exercise;Gait training;Balance training;Stair training;Functional mobility training;Therapeutic activities;Patient/family education    PT Goals (Current  goals can be found in the Care Plan section)  Acute Rehab PT Goals Patient Stated Goal: home PT Goal Formulation: With patient Time For Goal Achievement: 06/16/19 Potential to Achieve Goals: Good    Frequency Min 3X/week   Barriers to discharge        Co-evaluation               AM-PAC PT "6 Clicks" Mobility  Outcome Measure Help needed turning from your back to your side while in a flat bed without using bedrails?: None Help needed moving from lying on your back to sitting on the side of a flat bed without using bedrails?: A Little Help needed moving to and from a bed to a chair (including a wheelchair)?: A Little Help needed standing up from a chair using your arms (e.g., wheelchair or bedside chair)?: A Little Help needed to walk in hospital room?: A Little Help needed climbing 3-5 steps with a railing? : A Little 6 Click Score: 19    End of Session Equipment Utilized During Treatment: Oxygen Activity Tolerance: Patient limited by fatigue Patient left: in bed;with call bell/phone within reach Nurse Communication: Mobility status PT Visit Diagnosis: Other abnormalities of gait and mobility (R26.89)    Time: 7846-9629 PT Time Calculation (min) (ACUTE ONLY): 19 min   Charges:   PT Evaluation $PT Eval Moderate Complexity: 1 Mod          Aida Raider, PT  Office # [phone] Pager [phone]   Ilda Foil 06/02/2019, 11:52 AM

## 2019-06-02 NOTE — Progress Notes (Signed)
Joan Allen is a 43 y.o. female patient admitted from ED awake, alert - oriented  X 4 - no acute distress noted.  VSS - Blood pressure 124/87, pulse 91, temperature 97.7 F (36.5 C), temperature source Oral, resp. rate (!) 25, height 5\' 11"  (1.803 m), weight 109.8 kg, SpO2 92 %.    IVs in place, occlusive dsg intact without redness.  Skin intact, with no signs of breakdown or tears. Admission INP armband ID verified with patient/family, and in place.   Call light within reach, patient able to voice, and demonstrate understanding.     Will cont to eval and treat per MD orders.  Jeanella Craze, RN 06/02/2019 4:51 PM

## 2019-06-02 NOTE — Progress Notes (Signed)
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  Associated with fevers, chills, and intermittent dry cough.  Denies any cardiac or GI symptoms.  Outpatient COVID-19 test positive on 05/28/2019. CXR with bibasilar infiltrates versus atelectasis.  Admitted for COVID-19 viral pneumonia.  Initially requiring 2 L of oxygen by nasal cannula, now on room air.  Started on remdesivir and Decadron on admission.  06/02/19: Seen and examined.  Generalized weakness is improving but persistent.  PT OT to assess.  Denies chest pain.  Still has intermittent nonproductive cough.  On COVID-19 directed therapy, bronchodilators and flutter valve.   Assessment/Plan: Principal Problem:   SIRS (systemic inflammatory response syndrome) (HCC) Active Problems:   Hyperlipidemia   COVID-19 virus infection   ARF (acute renal failure) (HCC)   Thrombocytopenia (HCC)  SIRS secondary to COVID-19 pneumonia Presented with tachycardia and tachypnea Positive outpatient COVID-19 on 05/28/2019 Started on IV remdesivir on Decadron admission Day #3/5 Of remdesivir (end date 12/17) and day number 3 out of 10 of IV Decadron 6 mg daily Initially requiring 2 L, now on room air Personally reviewed chest x-ray which showed scattered linear opacities worse at the lung bases left greater than right, infiltrates versus atelectasis.  Procalcitonin 0.26, less likely superimposed bacterial pneumonia Started bronchodilators Xopenex inhaler 2 puffs 3 times daily and ipratropium inhaler 2 puffs 3 times daily Started vitamin C, D3 and zinc. Start flutter valve every 4 hours Maintain O2 saturation greater than 92% Inflammatory markers are trending down,  continue to trend. Urine culture taken on 05/31/2019 reintubated for better growth. Blood cultures taken on 05/31/2019 x2 negative to date  Generalized weakness likely in the setting of acute COVID-19 viral infection PT OT to assess Encourage oral protein calorie intake Continue to monitor vital signs and inflammatory markers.  Steroid-induced hyperglycemia On IV Decadron for COVID-19 pneumonia Hemoglobin A1c 5.9 on 06/02/2019. Start sensitive insulin sliding scale  Hyperlipidemia LDL elevated Continue home statin  Obesity BMI 33 Recommend weight loss outpatient with regular physical activity and healthy dieting post COVID-19 viral pneumonia treatment.  High Anion gap metabolic acidosis Low chemistry bicarb 18 and high anion gap 17 Start sodium bicarb 1300 mg twice daily x2 days. Continue daily renal panel  Resolved thrombocytopenia likely in the setting of acute viral illness.   DVT prophylaxis: Lovenox subcu daily Code Status: Full code. Family Communication: None at bedside.  Disposition Plan: Plan to discharge after patient completes IV Remdesivir.  Consults called: None.   Objective: Vitals:   06/02/19 0800 06/02/19 0910 06/02/19 1000 06/02/19 1042  BP: 135/90 109/83 106/79 112/83  Pulse: 89 (!) 103 84 86  Resp: (!) 25 (!) 21 (!) 21 (!) 25  Temp:      TempSrc:      SpO2: 93% 92% 91% 94%  Weight:      Height:        Intake/Output Summary (Last 24 hours) at 06/02/2019 1227 Last data filed at 06/02/2019 0945 Gross per 24 hour  Intake 200 ml  Output --  Net 200 ml   Filed Weights   06/01/19 0907  Weight: 109.8 kg    Exam:  . General: 43 y.o. year-old female obese, weak appearing.  Alert and oriented x3.  . Cardiovascular: Regular rate and  rhythm no rubs or gallops.  No JVD or thyromegaly noted. Marland Kitchen Respiratory: Faint rales at bases.  No wheezing noted.  Poor inspiratory effort.   . Abdomen: Soft nontender normal bowel sounds  present . Musculoskeletal: No lower extremity edema.  12 4 pulses in all 4 extremities.   Marland Kitchen Psychiatry: Mood is appropriate for condition and setting.   Data Reviewed: CBC: Recent Labs  Lab 05/31/19 1909 06/01/19 0602  WBC 5.7 7.1  NEUTROABS 4.6 6.3  HGB 15.0 15.1*  HCT 42.8 44.5  MCV 89.5 91.9  PLT 144* 025   Basic Metabolic Panel: Recent Labs  Lab 05/31/19 1909 06/01/19 0602 06/02/19 0609  NA 136 140 141  K 3.5 4.4 4.7  CL 101 103 106  CO2 21* 24 18*  GLUCOSE 135* 180* 186*  BUN 11 10 18   CREATININE 1.02* 0.87 0.76  CALCIUM 9.1 9.2 8.9   GFR: Estimated Creatinine Clearance: 121.1 mL/min (by C-G formula based on SCr of 0.76 mg/dL). Liver Function Tests: Recent Labs  Lab 05/31/19 1909 06/01/19 0602 06/02/19 0609  AST 41 38 39  ALT 39 37 34  ALKPHOS 49 47 35*  BILITOT 1.1 0.6 1.1  PROT 7.5 7.3 6.8  ALBUMIN 3.7 3.6 3.1*   No results for input(s): LIPASE, AMYLASE in the last 168 hours. No results for input(s): AMMONIA in the last 168 hours. Coagulation Profile: No results for input(s): INR, PROTIME in the last 168 hours. Cardiac Enzymes: No results for input(s): CKTOTAL, CKMB, CKMBINDEX, TROPONINI in the last 168 hours. BNP (last 3 results) No results for input(s): PROBNP in the last 8760 hours. HbA1C: Recent Labs    06/02/19 0609  HGBA1C 5.9*   CBG: No results for input(s): GLUCAP in the last 168 hours. Lipid Profile: Recent Labs    05/31/19 1909  TRIG 117   Thyroid Function Tests: No results for input(s): TSH, T4TOTAL, FREET4, T3FREE, THYROIDAB in the last 72 hours. Anemia Panel: Recent Labs    05/31/19 1909 06/01/19 0602  FERRITIN 1,131* 995*   Urine analysis:    Component Value Date/Time   COLORURINE YELLOW 05/31/2019 [place] 05/31/2019 2359   LABSPEC 1.012 05/31/2019 2359   PHURINE 5.0 05/31/2019 2359   GLUCOSEU NEGATIVE 05/31/2019 [place] NEGATIVE 05/31/2019 2359   BILIRUBINUR NEGATIVE 05/31/2019 2359    KETONESUR NEGATIVE 05/31/2019 2359   PROTEINUR NEGATIVE 05/31/2019 2359   NITRITE NEGATIVE 05/31/2019 2359   LEUKOCYTESUR NEGATIVE 05/31/2019 2359   Sepsis Labs: @LABRCNTIP (procalcitonin:4,lacticidven:4)  ) Recent Results (from the past 240 hour(s))  Novel Coronavirus, NAA ([facility])     Status: Abnormal   Collection Time: 05/28/19 12:00 AM   Specimen: Nasopharyngeal(NP) swabs in vial transport medium   NASOPHARYNGE  TESTING  Result Value Ref Range Status   SARS-CoV-2, NAA Detected (A) Not Detected Final    Comment: This nucleic acid amplification test was developed and its performance characteristics determined by [employer]. Nucleic acid amplification tests include PCR and TMA. This test has not been FDA cleared or approved. This test has been authorized by FDA under an Emergency Use Authorization (EUA). This test is only authorized for the duration of time the declaration that circumstances exist justifying the authorization of the emergency use of in vitro diagnostic tests for detection of SARS-CoV-2 virus and/or diagnosis of COVID-19 infection under section 564(b)(1) of the Act, 21 U.S.C. 427CWC-3(J) (1), unless the authorization is terminated or revoked sooner. When diagnostic testing is negative, the possibility of a false  negative result should be considered in the context of a patient's recent exposures and the presence of clinical signs and symptoms consistent with COVID-19. An individual without symptoms of COVID-19 and who is not shedding SARS-CoV-2 virus would  expect to have a negative (not detected) result in this assay.   Urine culture     Status: None (Preliminary result)   Collection Time: 05/31/19  8:03 AM   Specimen: In/Out Cath Urine  Result Value Ref Range Status   Specimen Description IN/OUT CATH URINE  Final   Special Requests NONE  Final   Culture   Final    CULTURE REINCUBATED FOR BETTER GROWTH Performed at [facility], [address] [place], [place] [address]    Report Status PENDING  Incomplete  Blood Culture (routine x 2)     Status: None (Preliminary result)   Collection Time: 05/31/19  7:09 PM   Specimen: BLOOD RIGHT HAND  Result Value Ref Range Status   Specimen Description BLOOD RIGHT HAND  Final   Special Requests   Final    BOTTLES DRAWN AEROBIC AND ANAEROBIC Blood Culture results may not be optimal due to an excessive volume of blood received in culture bottles   Culture   Final    NO GROWTH < 12 HOURS Performed at [facility], [address] [place], [place] [address]    Report Status PENDING  Incomplete  Blood Culture (routine x 2)     Status: None (Preliminary result)   Collection Time: 05/31/19 11:55 PM   Specimen: BLOOD  Result Value Ref Range Status   Specimen Description BLOOD RIGHT ANTECUBITAL  Final   Special Requests   Final    BOTTLES DRAWN AEROBIC AND ANAEROBIC Blood Culture results may not be optimal due to an inadequate volume of blood received in culture bottles   Culture   Final    NO GROWTH < 12 HOURS Performed at [facility], [address] [place], [place] [address]    Report Status PENDING  Incomplete      Studies: No results found.  Scheduled Meds: . atorvastatin  40 mg Oral q1800  . dexamethasone (DECADRON) injection  6 mg Intravenous Q24H  . enoxaparin (LOVENOX) injection  40 mg Subcutaneous QHS  . ipratropium  2 puff Inhalation Q8H  . levalbuterol  2 puff Inhalation Q8H    Continuous Infusions: . remdesivir 100 mg in NS 100 mL Stopped (06/02/19 0945)     LOS: 2 days     Darlin Droparole N Malonie Tatum, MD [facility] Pager [phone]  If 7PM-7AM, please contact night-coverage [email or web address] Password [email or web address] 06/02/2019, 12:27 PM

## 2019-06-02 NOTE — ED Notes (Signed)
Lunch Tray Ordered @ 1126.  

## 2019-06-02 NOTE — ED Notes (Signed)
Pt sitting up in bed eating breakfast

## 2019-06-02 NOTE — ED Notes (Signed)
Lunch tray given @ 1250 

## 2019-06-03 LAB — COMPREHENSIVE METABOLIC PANEL
ALT: 37 U/L (ref 0–44)
AST: 30 U/L (ref 15–41)
Albumin: 3.4 g/dL — ABNORMAL LOW (ref 3.5–5.0)
Alkaline Phosphatase: 41 U/L (ref 38–126)
Anion gap: 14 (ref 5–15)
BUN: 16 mg/dL (ref 6–20)
CO2: 25 mmol/L (ref 22–32)
Calcium: 9.1 mg/dL (ref 8.9–10.3)
Chloride: 102 mmol/L (ref 98–111)
Creatinine, Ser: 0.78 mg/dL (ref 0.44–1.00)
GFR calc Af Amer: >60 mL/min (ref >60)
GFR calc non Af Amer: >60 mL/min (ref >60)
Glucose, Bld: 203 mg/dL — ABNORMAL HIGH (ref 70–99)
Potassium: 4.2 mmol/L (ref 3.5–5.1)
Sodium: 141 mmol/L (ref 135–145)
Total Bilirubin: 0.4 mg/dL (ref 0.3–1.2)
Total Protein: 7.4 g/dL (ref 6.5–8.1)

## 2019-06-03 LAB — CBC WITH DIFFERENTIAL/PLATELET
Abs Immature Granulocytes: 0.04 10*3/uL (ref 0.00–0.07)
Basophils Absolute: 0 10*3/uL (ref 0.0–0.1)
Basophils Relative: 0 %
Eosinophils Absolute: 0 10*3/uL (ref 0.0–0.5)
Eosinophils Relative: 0 %
HCT: 42.3 % (ref 36.0–46.0)
Hemoglobin: 14.5 g/dL (ref 12.0–15.0)
Immature Granulocytes: 1 %
Lymphocytes Relative: 13 %
Lymphs Abs: 0.7 10*3/uL (ref 0.7–4.0)
MCH: 31.3 pg (ref 26.0–34.0)
MCHC: 34.3 g/dL (ref 30.0–36.0)
MCV: 91.4 fL (ref 80.0–100.0)
Monocytes Absolute: 0.3 10*3/uL (ref 0.1–1.0)
Monocytes Relative: 4 %
Neutro Abs: 4.6 10*3/uL (ref 1.7–7.7)
Neutrophils Relative %: 82 %
Platelets: 271 10*3/uL (ref 150–400)
RBC: 4.63 MIL/uL (ref 3.87–5.11)
RDW: 11.9 % (ref 11.5–15.5)
WBC: 5.7 10*3/uL (ref 4.0–10.5)
nRBC: 0 % (ref 0.0–0.2)

## 2019-06-03 LAB — GLUCOSE, CAPILLARY
Glucose-Capillary: 232 mg/dL — ABNORMAL HIGH (ref 70–99)
Glucose-Capillary: 199 mg/dL — ABNORMAL HIGH (ref 70–99)
Glucose-Capillary: 163 mg/dL — ABNORMAL HIGH (ref 70–99)
Glucose-Capillary: 145 mg/dL — ABNORMAL HIGH (ref 70–99)

## 2019-06-03 LAB — FERRITIN: Ferritin: 1008 ng/mL — ABNORMAL HIGH (ref 11–307)

## 2019-06-03 LAB — C-REACTIVE PROTEIN: CRP: 5.2 mg/dL — ABNORMAL HIGH (ref <1.0)

## 2019-06-03 LAB — FIBRINOGEN: Fibrinogen: 634 mg/dL — ABNORMAL HIGH (ref 210–475)

## 2019-06-03 LAB — D-DIMER, QUANTITATIVE: D-Dimer, Quant: 0.47 ug/mL-FEU (ref 0.00–0.50)

## 2019-06-03 LAB — LACTATE DEHYDROGENASE: LDH: 214 U/L — ABNORMAL HIGH (ref 98–192)

## 2019-06-03 NOTE — Progress Notes (Signed)
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  Associated with fevers, chills, and intermittent dry cough.  Denies any cardiac or GI symptoms.  Outpatient COVID-19 test positive on 05/28/2019. CXR with bibasilar infiltrates versus atelectasis.  Admitted for COVID-19 viral pneumonia.  Initially requiring 2 L of oxygen by nasal cannula, now on room air.  Started on remdesivir and Decadron on admission.  SUB  has intermittent nonproductive cough. No cp, f,loss of taste nor smell   Assessment/Plan: Principal Problem:   SIRS (systemic inflammatory response syndrome) (HCC) Active Problems:   Hyperlipidemia   COVID-19 virus infection   ARF (acute renal failure) (HCC)   Thrombocytopenia (HCC)  SIRS secondary to COVID-19 pneumonia Presented with tachycardia and tachypnea Positive outpatient COVID-19 on 05/28/2019 Started on IV remdesivir on Decadron admission Day #4/5 Of remdesivir (end date 12/17) and day number 3 out of 10 of IV Decadron 6 mg daily Initially requiring 2 L, now on room air Personally reviewed chest x-ray which showed scattered linear opacities worse at the lung bases left greater than right, infiltrates versus atelectasis.  Procalcitonin 0.26, less likely superimposed bacterial pneumonia Started bronchodilators Xopenex inhaler 2 puffs 3 times daily and ipratropium inhaler 2 puffs 3 times daily Started vitamin C, D3 and zinc. Start flutter valve every 4 hours Maintain O2 saturation greater than 92% Inflammatory markers are trending down, continue to trend over next day or 2 Urine culture taken on 05/31/2019 reintubated for better growth. Blood cultures taken on 05/31/2019 x2 negative  to date  Generalized weakness likely in the setting of acute COVID-19 viral infection PT OT to assess Encourage oral protein calorie intake Continue to monitor vital signs and inflammatory markers.  Steroid-induced hyperglycemia On IV Decadron for COVID-19 pneumonia Hemoglobin A1c 5.9 on 06/02/2019-CBG 199-232 Start sensitive insulin sliding scale Not on PTA meds  Hyperlipidemia LDL elevated Continue home statin  Obesity BMI 33 Recommend weight loss outpatient with regular physical activity and healthy dieting post COVID-19 viral pneumonia treatment.  High Anion gap metabolic acidosis stop sodium bicarb 1300 mg twice daily x2 days. Continue daily renal panel  Resolved thrombocytopenia likely in the setting of acute viral illness.   DVT prophylaxis: Lovenox subcu daily Code Status: Full code. Family Communication: None at bedside.  Disposition Plan: Plan to discharge after patient completes IV Remdesivir.  Consults called: None.   Objective: Vitals:   06/02/19 1631 06/02/19 2016 06/03/19 0406 06/03/19 1351  BP: 124/87 (!) 114/91 121/82 119/77  Pulse:  87 82 98  Resp:  (!) 21 19 16   Temp: 97.7 F (36.5 C) 98.7 F (37.1 C) 98.5 F (36.9 C) 97.9 F (36.6 C)  TempSrc: Oral Oral Oral Oral  SpO2: 92% 95% 92% 94%  Weight:      Height:        Intake/Output Summary (Last 24 hours) at 06/03/2019 1457 Last data filed at 06/03/2019 1200 Gross per 24 hour  Intake 240 ml  Output --  Net 240 ml   Filed Weights   06/01/19 0907  Weight: 109.8 kg    Exam:  Awake coherent in and no focal deficit smiling mallampati 3 cta b no added sound abd soft obese nt nd no rebound no gaurding Neuro intact no focal deficit  Data Reviewed:  CBC: Recent Labs  Lab 05/31/19 1909 06/01/19 0602 06/03/19 0441  WBC 5.7 7.1 5.7  NEUTROABS 4.6 6.3 4.6  HGB 15.0 15.1* 14.5  HCT 42.8 44.5 42.3  MCV 89.5 91.9 91.4  PLT 144* 156 271   Basic Metabolic Panel: Recent Labs  Lab  05/31/19 1909 06/01/19 0602 06/02/19 0609 06/03/19 0441  NA 136 140 141 141  K 3.5 4.4 4.7 4.2  CL 101 103 106 102  CO2 21* 24 18* 25  GLUCOSE 135* 180* 186* 203*  BUN 11 10 18 16   CREATININE 1.02* 0.87 0.76 0.78  CALCIUM 9.1 9.2 8.9 9.1   GFR: Estimated Creatinine Clearance: 121.1 mL/min (by C-G formula based on SCr of 0.78 mg/dL). Liver Function Tests: Recent Labs  Lab 05/31/19 1909 06/01/19 0602 06/02/19 0609 06/03/19 0441  AST 41 38 39 30  ALT 39 37 34 37  ALKPHOS 49 47 35* 41  BILITOT 1.1 0.6 1.1 0.4  PROT 7.5 7.3 6.8 7.4  ALBUMIN 3.7 3.6 3.1* 3.4*   No results for input(s): LIPASE, AMYLASE in the last 168 hours. No results for input(s): AMMONIA in the last 168 hours. Coagulation Profile: No results for input(s): INR, PROTIME in the last 168 hours. Cardiac Enzymes: No results for input(s): CKTOTAL, CKMB, CKMBINDEX, TROPONINI in the last 168 hours. BNP (last 3 results) No results for input(s): PROBNP in the last 8760 hours. HbA1C: Recent Labs    06/02/19 0609  HGBA1C 5.9*   CBG: Recent Labs  Lab 06/02/19 1734 06/02/19 2018 06/03/19 0938 06/03/19 1205  GLUCAP 135* 156* 232* 199*   Lipid Profile: Recent Labs    05/31/19 1909  TRIG 117   Thyroid Function Tests: No results for input(s): TSH, T4TOTAL, FREET4, T3FREE, THYROIDAB in the last 72 hours. Anemia Panel: Recent Labs    06/02/19 0609 06/03/19 0441  FERRITIN 986* 1,008*   Urine analysis:    Component Value Date/Time   COLORURINE YELLOW 05/31/2019 2359   APPEARANCEUR CLEAR 05/31/2019 2359   LABSPEC 1.012 05/31/2019 2359   PHURINE 5.0 05/31/2019 2359   GLUCOSEU NEGATIVE 05/31/2019 2359   HGBUR NEGATIVE 05/31/2019 2359   BILIRUBINUR NEGATIVE 05/31/2019 2359   KETONESUR NEGATIVE 05/31/2019 2359   PROTEINUR NEGATIVE 05/31/2019 2359   NITRITE NEGATIVE 05/31/2019 2359   LEUKOCYTESUR NEGATIVE 05/31/2019 2359   Sepsis Labs: @LABRCNTIP (procalcitonin:4,lacticidven:4)  ) Recent Results  (from the past 240 hour(s))  Novel Coronavirus, NAA ([facility])     Status: Abnormal   Collection Time: 05/28/19 12:00 AM   Specimen: Nasopharyngeal(NP) swabs in vial transport medium   NASOPHARYNGE  TESTING  Result Value Ref Range Status   SARS-CoV-2, NAA Detected (A) Not Detected Final    Comment: This nucleic acid amplification test was developed and its performance characteristics determined by [facility]. Nucleic acid amplification tests include PCR and TMA. This test has not been FDA cleared or approved. This test has been authorized by FDA under an Emergency Use Authorization (EUA). This test is only authorized for the duration of time the declaration that circumstances exist justifying the authorization of the emergency use of in vitro diagnostic tests for detection of SARS-CoV-2 virus and/or diagnosis of COVID-19 infection under section 564(b)(1) of the Act, 21 U.S.C. 161WRU-0(A360bbb-3(b) (1), unless the authorization is terminated or revoked sooner. When diagnostic testing is negative, the possibility of a false negative result should be considered in the context of a patient's recent exposures and the presence of clinical signs and symptoms consistent with COVID-19. An individual without symptoms  of COVID-19 and who is not shedding SARS-CoV-2 virus would  expect to have a negative (not detected) result in this assay.   Urine culture     Status: None (Preliminary result)   Collection Time: 05/31/19  8:03 AM   Specimen: In/Out Cath Urine  Result Value Ref Range Status   Specimen Description IN/OUT CATH URINE  Final   Special Requests NONE  Final   Culture   Final    CULTURE REINCUBATED FOR BETTER GROWTH Performed at [facility], [place] [address], [place], [place] [address]    Report Status PENDING  Incomplete  Blood Culture (routine x 2)     Status: None (Preliminary result)   Collection Time: 05/31/19  7:09 PM   Specimen: BLOOD RIGHT HAND  Result Value Ref Range  Status   Specimen Description BLOOD RIGHT HAND  Final   Special Requests   Final    BOTTLES DRAWN AEROBIC AND ANAEROBIC Blood Culture results may not be optimal due to an excessive volume of blood received in culture bottles   Culture   Final    NO GROWTH 3 DAYS Performed at [facility], [place] [address], [place], [place] [address]    Report Status PENDING  Incomplete  Blood Culture (routine x 2)     Status: None (Preliminary result)   Collection Time: 05/31/19 11:55 PM   Specimen: BLOOD  Result Value Ref Range Status   Specimen Description BLOOD RIGHT ANTECUBITAL  Final   Special Requests   Final    BOTTLES DRAWN AEROBIC AND ANAEROBIC Blood Culture results may not be optimal due to an inadequate volume of blood received in culture bottles   Culture   Final    NO GROWTH 2 DAYS Performed at [facility], [place] [address] [place],  [address]    Report Status PENDING  Incomplete      Studies: No results found.  Scheduled Meds: . atorvastatin  40 mg Oral q1800  . dexamethasone (DECADRON) injection  6 mg Intravenous Q24H  . enoxaparin (LOVENOX) injection  40 mg Subcutaneous QHS  . insulin aspart  0-6 Units Subcutaneous TID WC  . ipratropium  2 puff Inhalation Q8H  . levalbuterol  2 puff Inhalation Q8H  . sodium bicarbonate  1,300 mg Oral BID    Continuous Infusions: . remdesivir 100 mg in NS 100 mL 100 mg (06/03/19 1038)     LOS: 3 days   Verneita Griffes, MD [facility] 3:02 PM   If 7PM-7AM, please contact night-coverage [email or web address] Password [email or web address] 06/03/2019, 2:57 PM

## 2019-06-03 NOTE — Evaluation (Signed)
Occupational Therapy Evaluation Patient Details Name: Joan Allen MRN: [id number] DOB: 11/25/1975 Today's Date: 06/03/2019    History of Present Illness 43 y.o. female with history of hyperlipidemia presents to the ER with complaint of persistent generalized body ache with fever and chills. Patient admitted for SIRS secondary to COVID-19 pneumonia.   Clinical Impression   This 43 y/o female presents with the above. PTA pt reports independence with ADL, iADL and functional mobility. Pt currently presenting with weakness, decreased activity tolerance/endurance and impaired respiratory status. Pt performing room level functional mobility without AD and overall minA (+2 safety/equipment utilized this session). She currently requires minA for LB and toileting ADL, setup/minguard assist for seated UB and grooming ADL. Pt on 4L with activity, lowest SpO2 noted 86% with max HR 139 with activity. Pt requiring increased time for all tasks and seated rest, min cues for use of deep breathing techniques. Pt will benefit from continued acute OT services and recommend follow up [facility] services after discharge to progress pt towards her PLOF. Will follow.     Follow Up Recommendations  Home health OT;Supervision/Assistance - 24 hour    Equipment Recommendations  None recommended by OT           Precautions / Restrictions Precautions Precautions: Other (comment) Precaution Comments: watch sats Restrictions Weight Bearing Restrictions: No      Mobility Bed Mobility Overal bed mobility: Needs Assistance Bed Mobility: Supine to Sit     Supine to sit: Min guard     General bed mobility comments: for lines and safety, increased time/effort  Transfers Overall transfer level: Needs assistance Equipment used: None Transfers: Sit to/from Stand Sit to Stand: Min assist         General transfer comment: boosting and steadying assist to rise to standing, pt stood from EOB, toilet, and  regular chair in front of sink    Balance Overall balance assessment: Needs assistance Sitting-balance support: Feet supported Sitting balance-Leahy Scale: Good     Standing balance support: Single extremity supported;During functional activity Standing balance-Leahy Scale: Fair Standing balance comment: overall minA for mobility tasks                           ADL either performed or assessed with clinical judgement   ADL Overall ADL's : Needs assistance/impaired Eating/Feeding: Modified independent;Sitting   Grooming: Wash/dry face;Wash/dry hands;Oral care;Set up;Min guard;Sitting Grooming Details (indicate cue type and reason): seated at sink in room Upper Body Bathing: Set up;Min guard;Sitting   Lower Body Bathing: Minimal assistance;Sit to/from stand   Upper Body Dressing : Set up;Min guard;Sitting   Lower Body Dressing: Minimal assistance;Sit to/from stand   Toilet Transfer: Minimal assistance;+2 for safety/equipment;Ambulation;Regular Toilet;Grab bars   Toileting- Clothing Manipulation and Hygiene: Minimal assistance;Sit to/from stand;Sitting/lateral lean Toileting - Clothing Manipulation Details (indicate cue type and reason): pt performing pericare via lateral leans; minA for gown mangement while pt manages pants/underwear; assist for balance in standing     Functional mobility during ADLs: Minimal assistance;+2 for safety/equipment General ADL Comments: pt with overall weakness, decreased endurance/activity tolerance and respiratory status     Vision         Perception     Praxis      Pertinent Vitals/Pain Pain Assessment: No/denies pain     Hand Dominance Right   Extremity/Trunk Assessment Upper Extremity Assessment Upper Extremity Assessment: Generalized weakness   Lower Extremity Assessment Lower Extremity Assessment: Defer to PT evaluation  Cervical / Trunk Assessment Cervical / Trunk Assessment: Normal   Communication  Communication Communication: No difficulties   Cognition Arousal/Alertness: Awake/alert Behavior During Therapy: WFL for tasks assessed/performed Overall Cognitive Status: Within Functional Limits for tasks assessed                                     General Comments       Exercises Exercises: Other exercises Other Exercises Other Exercises: use of IS x5-6 reps, pt able to pull avg 750 ml   Shoulder Instructions      Home Living Family/patient expects to be discharged to:: Private residence Living Arrangements: Spouse/significant other;Children Available Help at Discharge: Family;Available 24 hours/day Type of Home: House Home Access: Stairs to enter [employer] of Steps: 3 Entrance Stairs-Rails: Right;Left;Can reach both Home Layout: Two level;Able to live on main level with bedroom/bathroom Alternate Level Stairs-Number of Steps: flight Alternate Level Stairs-Rails: Right Bathroom Shower/Tub: Producer, television/film/video: Standard     Home Equipment: Shower seat - built in          Prior Functioning/Environment Level of Independence: Independent        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        OT Problem List: Decreased strength;Decreased activity tolerance;Impaired balance (sitting and/or standing);Decreased knowledge of use of DME or AE;Cardiopulmonary status limiting activity      OT Treatment/Interventions: Self-care/ADL training;Therapeutic exercise;Energy conservation;DME and/or AE instruction;Therapeutic activities;Patient/family education;Balance training    OT Goals(Current goals can be found in the care plan section) Acute Rehab OT Goals Patient Stated Goal: home OT Goal Formulation: With patient Time For Goal Achievement: 06/17/19 Potential to Achieve Goals: Good  OT Frequency: Min 2X/week   Barriers to D/C:            Co-evaluation PT/OT/SLP Co-Evaluation/Treatment: Yes Reason for  Co-Treatment: For patient/therapist safety;To address functional/ADL transfers   OT goals addressed during session: ADL's and self-care      AM-PAC OT "6 Clicks" Daily Activity     Outcome Measure Help from another person eating meals?: None Help from another person taking care of personal grooming?: A Little Help from another person toileting, which includes using toliet, bedpan, or urinal?: A Little Help from another person bathing (including washing, rinsing, drying)?: A Lot Help from another person to put on and taking off regular upper body clothing?: None Help from another person to put on and taking off regular lower body clothing?: A Lot 6 Click Score: 18   End of Session Equipment Utilized During Treatment: Gait belt;Oxygen Nurse Communication: Mobility status  Activity Tolerance: Patient tolerated treatment well Patient left: with call bell/phone within reach;Other (comment)(seated at sink to continue bathing ADL with NT)  OT Visit Diagnosis: Unsteadiness on feet (R26.81);Muscle weakness (generalized) (M62.81);Other (comment)(decreased activity tolerance)                Time: 2536-6440 OT Time Calculation (min): 26 min Charges:  OT General Charges $OT Visit: 1 Visit OT Evaluation $OT Eval Moderate Complexity: 1 Mod  Marcy Siren, OT [employer] Pager [phone] Office [phone]   Orlando Penner 06/03/2019, 1:24 PM

## 2019-06-03 NOTE — Progress Notes (Signed)
Physical Therapy Treatment Patient Details Name: Joan Allen MRN: [id number] DOB: [birth year] Today's Date: 06/03/2019    History of Present Illness 43 y.o. female with history of hyperlipidemia presents to the ER with complaint of persistent generalized body ache with fever and chills. Patient admitted for SIRS secondary to COVID-19 pneumonia.    PT Comments    Pt agreeable to working with therapy and requests to ambulate to the bathroom. Pt limited in safe mobility by oxygen desaturation and elevated HR with mobility (see general comments). Pt is currently min guard for bed mobility and requires min A for transfers and ambulation. Pt with painful dry cough. Educated in use of incentive spirometer for improving oxygenation and possibly making cough more productive. PT plans remain appropriate at d/c. PT will continue to follow acutely.      Follow Up Recommendations  No PT follow up;Supervision for mobility/OOB     Equipment Recommendations  None recommended by PT       Precautions / Restrictions Precautions Precautions: Other (comment) Precaution Comments: watch sats Restrictions Weight Bearing Restrictions: No    Mobility  Bed Mobility Overal bed mobility: Needs Assistance Bed Mobility: Supine to Sit     Supine to sit: Min guard     General bed mobility comments: for lines and safety, increased time/effort  Transfers Overall transfer level: Needs assistance Equipment used: None Transfers: Sit to/from Stand Sit to Stand: Min assist         General transfer comment: boosting and steadying assist to rise to standing, pt stood from EOB, toilet, and regular chair in front of sink  Ambulation/Gait Ambulation/Gait assistance: Min guard;Min assist Gait Distance (Feet): 17 Feet Assistive device: None Gait Pattern/deviations: Step-through pattern;Decreased step length - right;Decreased step length - left;Shuffle Gait velocity: slowed Gait velocity  interpretation: <1.8 ft/sec, indicate of risk for recurrent falls General Gait Details: min guard for ambulation in room, minA for navigating bathroom        Balance Overall balance assessment: Needs assistance Sitting-balance support: Feet supported Sitting balance-Leahy Scale: Good     Standing balance support: Single extremity supported;During functional activity Standing balance-Leahy Scale: Fair Standing balance comment: overall minA for mobility tasks                            Cognition Arousal/Alertness: Awake/alert Behavior During Therapy: WFL for tasks assessed/performed Overall Cognitive Status: Within Functional Limits for tasks assessed                                        Exercises Other Exercises Other Exercises: use of IS x5-6 reps, pt able to pull avg 750 ml    General Comments General comments (skin integrity, edema, etc.): Pt on 4L O2 via [place] for ambulation SaO2 dropped to 87%O2, with cuing for pursed lipped breathing and 1 minute of seated rest SaO2 rebounded to 91%O2, with ambulation HR max 130bpm      Pertinent Vitals/Pain Pain Assessment: No/denies pain    Home Living Family/patient expects to be discharged to:: Private residence Living Arrangements: Spouse/significant other;Children Available Help at Discharge: Family;Available 24 hours/day Type of Home: House Home Access: Stairs to enter Entrance Stairs-Rails: Right;Left;Can reach both Home Layout: Two level;Able to live on main level with bedroom/bathroom Home Equipment: Shower seat - built in      Prior Function Level of Independence: Independent  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   PT Goals (current goals can now be found in the care plan section) Acute Rehab PT Goals Patient Stated Goal: home PT Goal Formulation: With patient Time For Goal Achievement: 06/16/19 Potential to Achieve Goals: Good Progress towards PT goals: Progressing toward  goals    Frequency    Min 3X/week      PT Plan Current plan remains appropriate    Co-evaluation PT/OT/SLP Co-Evaluation/Treatment: Yes Reason for Co-Treatment: For patient/therapist safety PT goals addressed during session: Mobility/safety with mobility OT goals addressed during session: ADL's and self-care      AM-PAC PT "6 Clicks" Mobility   Outcome Measure  Help needed turning from your back to your side while in a flat bed without using bedrails?: None Help needed moving from lying on your back to sitting on the side of a flat bed without using bedrails?: None Help needed moving to and from a bed to a chair (including a wheelchair)?: A Little Help needed standing up from a chair using your arms (e.g., wheelchair or bedside chair)?: None Help needed to walk in hospital room?: A Little Help needed climbing 3-5 steps with a railing? : A Little 6 Click Score: 21    End of Session Equipment Utilized During Treatment: Oxygen Activity Tolerance: Patient limited by fatigue Patient left: with call bell/phone within reach;in chair;Other (comment);with nursing/sitter in room(sitting at sink to wash up) Nurse Communication: Mobility status PT Visit Diagnosis: Other abnormalities of gait and mobility (R26.89)     Time: 5176-1607 PT Time Calculation (min) (ACUTE ONLY): 26 min  Charges:  $Gait Training: 8-22 mins                     Akacia Boltz B. Beverely Risen PT, DPT Acute Rehabilitation Services Pager [phone] Office [phone]    Elon Alas Fleet 06/03/2019, 3:00 PM

## 2019-06-03 NOTE — Progress Notes (Signed)
Performed desat test w/ pt. Pt's spO2 88% at rest on RA. Pt's spO2 92% at rest on 2L. Upon ambulating 64ft in hallway, pt's spO2 desated to 85% on 2L. Upon returning to rest, pt took approximately 2 minutes for spO2 to return to 92% while on 2L. Pt currently on 3L w/ spO2 at 93%

## 2019-06-04 ENCOUNTER — Encounter: Payer: Self-pay | Admitting: Family Medicine

## 2019-06-04 LAB — CBC WITH DIFFERENTIAL/PLATELET
Abs Immature Granulocytes: 0.1 10*3/uL — ABNORMAL HIGH (ref 0.00–0.07)
Basophils Absolute: 0 10*3/uL (ref 0.0–0.1)
Basophils Relative: 0 %
Eosinophils Absolute: 0 10*3/uL (ref 0.0–0.5)
Eosinophils Relative: 0 %
HCT: 38.6 % (ref 36.0–46.0)
Hemoglobin: 13.1 g/dL (ref 12.0–15.0)
Lymphocytes Relative: 8 %
Lymphs Abs: 0.4 10*3/uL — ABNORMAL LOW (ref 0.7–4.0)
MCH: 31.1 pg (ref 26.0–34.0)
MCHC: 33.9 g/dL (ref 30.0–36.0)
MCV: 91.7 fL (ref 80.0–100.0)
Monocytes Absolute: 0.1 10*3/uL (ref 0.1–1.0)
Monocytes Relative: 2 %
Myelocytes: 1 %
Neutro Abs: 4.6 10*3/uL (ref 1.7–7.7)
Neutrophils Relative %: 89 %
Platelets: 244 10*3/uL (ref 150–400)
RBC: 4.21 MIL/uL (ref 3.87–5.11)
RDW: 11.9 % (ref 11.5–15.5)
WBC: 5.2 10*3/uL (ref 4.0–10.5)
nRBC: 0 % (ref 0.0–0.2)
nRBC: 0 /100 WBC

## 2019-06-04 LAB — COMPREHENSIVE METABOLIC PANEL
ALT: 37 U/L (ref 0–44)
AST: 34 U/L (ref 15–41)
Albumin: 3.1 g/dL — ABNORMAL LOW (ref 3.5–5.0)
Alkaline Phosphatase: 40 U/L (ref 38–126)
Anion gap: 13 (ref 5–15)
BUN: 15 mg/dL (ref 6–20)
CO2: 25 mmol/L (ref 22–32)
Calcium: 8.9 mg/dL (ref 8.9–10.3)
Chloride: 102 mmol/L (ref 98–111)
Creatinine, Ser: 0.7 mg/dL (ref 0.44–1.00)
GFR calc Af Amer: >60 mL/min (ref >60)
GFR calc non Af Amer: >60 mL/min (ref >60)
Glucose, Bld: 190 mg/dL — ABNORMAL HIGH (ref 70–99)
Potassium: 4.1 mmol/L (ref 3.5–5.1)
Sodium: 140 mmol/L (ref 135–145)
Total Bilirubin: 0.7 mg/dL (ref 0.3–1.2)
Total Protein: 6.5 g/dL (ref 6.5–8.1)

## 2019-06-04 LAB — URINE CULTURE

## 2019-06-04 LAB — FERRITIN: Ferritin: 790 ng/mL — ABNORMAL HIGH (ref 11–307)

## 2019-06-04 LAB — C-REACTIVE PROTEIN: CRP: 1.9 mg/dL — ABNORMAL HIGH (ref <1.0)

## 2019-06-04 LAB — LACTATE DEHYDROGENASE: LDH: 186 U/L (ref 98–192)

## 2019-06-04 LAB — D-DIMER, QUANTITATIVE: D-Dimer, Quant: 0.49 ug/mL-FEU (ref 0.00–0.50)

## 2019-06-04 LAB — GLUCOSE, CAPILLARY
Glucose-Capillary: 163 mg/dL — ABNORMAL HIGH (ref 70–99)
Glucose-Capillary: 150 mg/dL — ABNORMAL HIGH (ref 70–99)

## 2019-06-04 LAB — FIBRINOGEN: Fibrinogen: 540 mg/dL — ABNORMAL HIGH (ref 210–475)

## 2019-06-04 MED ORDER — SALINE SPRAY 0.65 % NA SOLN
1.0000 | NASAL | Status: DC | PRN
  Filled 2019-06-04 (×2): qty 44

## 2019-06-04 MED ORDER — DEXAMETHASONE 6 MG PO TABS: 6.0000 mg | ORAL_TABLET | Freq: Every day | ORAL | 0 refills | Status: DC

## 2019-06-04 MED ORDER — ORAL CARE MOUTH RINSE
15.0000 mL | Freq: Two times a day (BID) | OROMUCOSAL | Status: DC
  Administered 2019-06-04: 15 mL via OROMUCOSAL

## 2019-06-04 MED ORDER — IPRATROPIUM BROMIDE HFA 17 MCG/ACT IN AERS: 2.0000 | INHALATION_SPRAY | Freq: Three times a day (TID) | RESPIRATORY_TRACT | 12 refills | Status: DC

## 2019-06-04 NOTE — Progress Notes (Signed)
Pt dc'd from unit to care with family. Education regarding home oxygen, new medications, and infection management provided. Pt acknowledged understanding these instructions. IVs removed. Telemetry discontinued.

## 2019-06-04 NOTE — Progress Notes (Signed)
Physical Therapy Treatment Patient Details Name: Joan Allen MRN: [id number] DOB: 09-05-1975 Today's Date: 06/04/2019    History of Present Illness 43 y.o. female with history of hyperlipidemia presents to the ER with complaint of persistent generalized body ache with fever and chills. Patient admitted for SIRS secondary to COVID-19 pneumonia.    PT Comments    Pt is sitting EoB getting ready for d/c however is agreeable to stair training before going. Pt is improved with her activity tolerance today and is min guard for transfers and ambulation without AD. Pt able to utilize RW (rails) to step up and over a step and then ascend/descend 1 step x 3 in progression to simulate transfer into her home. Pt also educated on energy conservation techniques with more strenuous activities like stair climbing.    Follow Up Recommendations  No PT follow up;Supervision for mobility/OOB     Equipment Recommendations  None recommended by PT       Precautions / Restrictions Precautions Precautions: Other (comment) Precaution Comments: watch sats Restrictions Weight Bearing Restrictions: No    Mobility  Bed Mobility               General bed mobility comments: pt seated EOB upon arrival  Transfers Overall transfer level: Needs assistance     Sit to Stand: Min guard         General transfer comment: min guard for sit>stand from bed   Ambulation/Gait Ambulation/Gait assistance: Min guard Gait Distance (Feet): 15 Feet Assistive device: None Gait Pattern/deviations: Step-through pattern;Decreased stride length;Shuffle Gait velocity: slowed Gait velocity interpretation: 1.31 - 2.62 ft/sec, indicative of limited community ambulator General Gait Details: min guard for ambulation during stair training   Stairs Stairs: Yes Stairs assistance: Min guard Stair Management: With walker;Forwards;Backwards;Two rails Number of Stairs: 1(1 time up and over step, 3x up and back on  one step) General stair comments: min guard for stepping up and over step and also for one step up and down 3x to monitor fatigue, educated on ways for energy conservation with stair ascent/descent       Balance Overall balance assessment: Needs assistance Sitting-balance support: Feet supported Sitting balance-Leahy Scale: Good     Standing balance support: Single extremity supported;During functional activity Standing balance-Leahy Scale: Fair                              Cognition Arousal/Alertness: Awake/alert Behavior During Therapy: WFL for tasks assessed/performed Overall Cognitive Status: Within Functional Limits for tasks assessed                                           General Comments General comments (skin integrity, edema, etc.): Educated on need for monitioring O2 with activities and need for rest/recovery after strenuous activities like stairs      Pertinent Vitals/Pain Pain Assessment: No/denies pain           PT Goals (current goals can now be found in the care plan section) Acute Rehab PT Goals Patient Stated Goal: home PT Goal Formulation: With patient Time For Goal Achievement: 06/16/19 Potential to Achieve Goals: Good Progress towards PT goals: Progressing toward goals    Frequency    Min 3X/week      PT Plan Current plan remains appropriate       AM-PAC PT "6 Clicks"  Mobility   Outcome Measure  Help needed turning from your back to your side while in a flat bed without using bedrails?: None Help needed moving from lying on your back to sitting on the side of a flat bed without using bedrails?: None Help needed moving to and from a bed to a chair (including a wheelchair)?: None Help needed standing up from a chair using your arms (e.g., wheelchair or bedside chair)?: None Help needed to walk in hospital room?: None Help needed climbing 3-5 steps with a railing? : None 6 Click Score: 24    End of  Session Equipment Utilized During Treatment: Oxygen Activity Tolerance: Patient tolerated treatment well Patient left: in bed;with nursing/sitter in room(getting ready for d/c ) Nurse Communication: Mobility status PT Visit Diagnosis: Other abnormalities of gait and mobility (R26.89)     Time: 1350-1405 PT Time Calculation (min) (ACUTE ONLY): 15 min  Charges:  $Gait Training: 8-22 mins                     Jerae Izard B. Migdalia Dk PT, DPT Acute Rehabilitation Services Pager [phone] Office [phone]    [place] 06/04/2019, 3:48 PM

## 2019-06-04 NOTE — Discharge Summary (Signed)
Physician Discharge Summary  Joan Allen OXB:[id number] DOB: 1975-07-09 DOA: 05/31/2019  PCP: Merlene Laughter, MD  Admit date: 05/31/2019 Discharge date: 06/04/2019  Time spent: 35 minutes  Recommendations for Outpatient Follow-up:  1. Patient needs to self isolate for total of 14 days and is aware of the same and she will get home monitoring 2. Oxygen, steroids prescribed on discharge and needs PCP appointment in about 1 week 3. Needs outpatient monitoring of sugars 4. 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  Associated with fevers, chills, and intermittent dry cough.  Denies any cardiac or GI symptoms.  Outpatient COVID-19 test positive on 05/28/2019. CXR with bibasilar infiltrates versus atelectasis.  Admitted for COVID-19 viral pneumonia.  Initially requiring 2 L of oxygen by nasal cannula, now on room air.  Started on remdesivir and Decadron on admission.  Hospital Course:  COVID-19 /SIRS patient hypoxic on admission received 5 days  remdesivir and will finish oral Decadron in the outpatient setting Became hypoxic with ambulation possibly more likely secondary to habitus so given oxygen on discharge biomarkers decreased during hospitalization and it was felt that this was the main cause of her illness  Steroid-induced hyperglycemia A1c 5.9 Was not on PTA meds-was  covered in the hospital with insulin but will probably not need this in the outpatient setting needs recheck in the outpatient setting  Hyperlipidemia Resume home statin  Gap acidosis Etiology was unclear but this was likely secondary to her illness and this resolved  Probable asymptomatic bacteriuria She had urine cultures done but it was unclean catch I think this is asymptomatic bacteriuria    Discharge Exam: Vitals:   06/04/19 0547 06/04/19 0611  BP: (!) 123/91   Pulse: 80   Resp: 18   Temp: 97.7 F (36.5 C)   SpO2:  94%    General: Awake coherent no distress EOMI NCAT no focal deficit Cardiovascular: S1-S2 no murmur rub or gallop Respiratory: Chest clear no added sound no rales no rhonchi Abdomen soft no rebound but quite obese No lower extremity edema  Discharge Instructions   Discharge Instructions    Diet - low sodium heart healthy   Complete by: As directed    Discharge instructions   Complete by: As directed    You were diagnosed with coronavirus 19 ensure that you take proper precautions as you are probably infectious for at least 10 days after the time of infection-you received medications this admission to help with your coronavirus status and has been determined that you will probably require oxygen at least short-term we have ordered that for you and this will be delivered to you Please follow-up with your primary care physician in 1 to 2 weeks once you are out of the infective duration-would recommend subzero [place] and I can write you a prescription so that you will get time away   Discharge instructions   Complete by: As directed    You were diagnosed  with coronavirus and we will do.  If You Need Self Isolate Once You Are Discharged He Is Unsure about You Use the Oxygen As Needed and This Will Be Short-Term You Will Need to Follow-Up with Primary Care Physician We Will Try and Arrange for You Complete Steroids over the Next Several Days As per the  Instructions on Take All of Them We Will Give You a Letter to Allow You to Go on Home and Get Excused from Work   Increase activity slowly   Complete by: As directed    Increase activity slowly   Complete by: As directed    MyChart COVID-19 home monitoring program   Complete by: Jun 04, 2019    Is the patient willing to use the MyChart Mobile App for home monitoring?: Yes   Temperature monitoring   Complete by: Jun 04, 2019    After how many days would you like to receive a notification of this patient's flowsheet entries?: 0     Allergies as of 06/04/2019   No Known Allergies     Medication List    TAKE these medications   atorvastatin 40 MG tablet Commonly known as: LIPITOR Take 1 tablet (40 mg total) by mouth daily.   dexamethasone 6 MG tablet Commonly known as: DECADRON Take 1 tablet (6 mg total) by mouth daily.   ipratropium 17 MCG/ACT inhaler Commonly known as: ATROVENT HFA Inhale 2 puffs into the lungs every 8 (eight) hours.   levonorgestrel 20 MCG/24HR IUD Commonly known as: MIRENA 1 each by Intrauterine route once.            Durable Medical Equipment  (From admission, onward)         Start     Ordered   06/04/19 0943  DME Oxygen  Once    Question Answer Comment  Length of Need 6 Months   Mode or (Route) Nasal cannula   Liters per Minute 3   Frequency Continuous (stationary and portable oxygen unit needed)   Oxygen delivery system Gas      06/04/19 0943   06/04/19 0941  DME Oxygen  Once    Question Answer Comment  Length of Need 6 Months   Mode or (Route) Nasal cannula   Liters per Minute 3   Frequency Continuous (stationary and portable oxygen unit needed)   Oxygen conserving device No   Oxygen delivery system Gas      06/04/19 0943         No Known Allergies    The results of significant diagnostics from this hospitalization (including imaging, microbiology, ancillary and laboratory) are listed below for reference.    Significant  Diagnostic Studies: DG Chest Port 1 View  Result Date: 05/31/2019 CLINICAL DATA:  Pt to triage via [facility]> Pt was tested for COVID 3 days ago but didn't have results. + results per Epic. C/o fever, body aches, cough, and SOB since 12/9. EXAM: PORTABLE CHEST 1 VIEW COMPARISON:  None. FINDINGS: The cardiomediastinal contours are within normal limits for AP technique. Low lung volumes. There are scattered linear opacities in the bilateral lung bases likely atelectasis. No pneumothorax or large pleural effusion. No acute finding in the visualized skeleton. IMPRESSION: Scattered linear opacities in the bilateral lung bases likely atelectasis. Electronically Signed   By: Emmaline KluverNancy  Ballantyne M.D.   On: 05/31/2019 20:27    Microbiology: Recent Results (from the past 240 hour(s))  Novel Coronavirus, NAA ([facility])     Status: Abnormal   Collection Time:  05/28/19 12:00 AM   Specimen: Nasopharyngeal(NP) swabs in vial transport medium   NASOPHARYNGE  TESTING  Result Value Ref Range Status   SARS-CoV-2, NAA Detected (A) Not Detected Final    Comment: This nucleic acid amplification test was developed and its performance characteristics determined by [employer]. Nucleic acid amplification tests include PCR and TMA. This test has not been FDA cleared or approved. This test has been authorized by FDA under an Emergency Use Authorization (EUA). This test is only authorized for the duration of time the declaration that circumstances exist justifying the authorization of the emergency use of in vitro diagnostic tests for detection of SARS-CoV-2 virus and/or diagnosis of COVID-19 infection under section 564(b)(1) of the Act, 21 U.S.C. 409WJX-9(J) (1), unless the authorization is terminated or revoked sooner. When diagnostic testing is negative, the possibility of a false negative result should be considered in the context of a patient's recent exposures and the presence of clinical signs and  symptoms consistent with COVID-19. An individual without symptoms of COVID-19 and who is not shedding SARS-CoV-2 virus would  expect to have a negative (not detected) result in this assay.   Urine culture     Status: None (Preliminary result)   Collection Time: 05/31/19  8:03 AM   Specimen: In/Out Cath Urine  Result Value Ref Range Status   Specimen Description IN/OUT CATH URINE  Final   Special Requests NONE  Final   Culture   Final    CULTURE REINCUBATED FOR BETTER GROWTH Performed at [facility], [address] [place], [place] [address]    Report Status PENDING  Incomplete  Blood Culture (routine x 2)     Status: None (Preliminary result)   Collection Time: 05/31/19  7:09 PM   Specimen: BLOOD RIGHT HAND  Result Value Ref Range Status   Specimen Description BLOOD RIGHT HAND  Final   Special Requests   Final    BOTTLES DRAWN AEROBIC AND ANAEROBIC Blood Culture results may not be optimal due to an excessive volume of blood received in culture bottles   Culture   Final    NO GROWTH 3 DAYS Performed at [facility], [address] [place], [place] [address]    Report Status PENDING  Incomplete  Blood Culture (routine x 2)     Status: None (Preliminary result)   Collection Time: 05/31/19 11:55 PM   Specimen: BLOOD  Result Value Ref Range Status   Specimen Description BLOOD RIGHT ANTECUBITAL  Final   Special Requests   Final    BOTTLES DRAWN AEROBIC AND ANAEROBIC Blood Culture results may not be optimal due to an inadequate volume of blood received in culture bottles   Culture   Final    NO GROWTH 2 DAYS Performed at [facility], [address], [place], [place] [address]    Report Status PENDING  Incomplete     Labs: Basic Metabolic Panel: Recent Labs  Lab 05/31/19 1909 06/01/19 0602 06/02/19 0609 06/03/19 0441 06/04/19 0500  NA 136 140 141 141 140  K 3.5 4.4 4.7 4.2 4.1  CL 101 103 106 102 102  CO2 21* 24 18* 25 25  GLUCOSE 135* 180* 186*  203* 190*  BUN CREATININE 1.02* 0.87 0.76 0.78 0.70  CALCIUM 9.1 9.2 8.9 9.1 8.9   Liver Function Tests: Recent Labs  Lab 05/31/19 1909 06/01/19 0602 06/02/19 0609 06/03/19 0441 06/04/19 0500  AST 41 38 39  30 34  ALT 39 37 34 37 37  ALKPHOS 49 47 35* 41 40  BILITOT 1.1 0.6 1.1 0.4 0.7  PROT 7.5 7.3 6.8 7.4 6.5  ALBUMIN 3.7 3.6 3.1* 3.4* 3.1*   No results for input(s): LIPASE, AMYLASE in the last 168 hours. No results for input(s): AMMONIA in the last 168 hours. CBC: Recent Labs  Lab 05/31/19 1909 06/01/19 0602 06/03/19 0441 06/04/19 0500  WBC 5.7 7.1 5.7 5.2  NEUTROABS 4.6 6.3 4.6 4.6  HGB 15.0 15.1* 14.5 13.1  HCT 42.8 44.5 42.3 38.6  MCV 89.5 91.9 91.4 91.7  PLT 144* 156 271 244   Cardiac Enzymes: No results for input(s): CKTOTAL, CKMB, CKMBINDEX, TROPONINI in the last 168 hours. BNP: BNP (last 3 results) No results for input(s): BNP in the last 8760 hours.  ProBNP (last 3 results) No results for input(s): PROBNP in the last 8760 hours.  CBG: Recent Labs  Lab 06/03/19 0938 06/03/19 1205 06/03/19 1727 06/03/19 2148 06/04/19 0820  GLUCAP 232* 199* 163* 145* 163*       Signed:  Nita Sells MD   [facility] 06/04/2019, 9:59 AM

## 2019-06-04 NOTE — TOC Transition Note (Addendum)
Transition of Care [facility]) - CM/SW Discharge Note   Patient Details  Name: Joan Allen MRN: [id number] Date of Birth: October 06, 1975  [facility] CM/SW Contact:  Maryclare Labrador, RN Phone Number: 06/04/2019, 11:08 AM   Clinical Narrative:   PTA independent from home with spouse.  Pt informed CM that she does not have a PCP - pt is interested in [facility].  CM set up first available appt for COVID positive pt - see AVS.  CM requested attending to write refills for discharge medications as PCP appt is > 30 days from discharge.  Pt in agreement for home oxygen - CM provided agency choice - [facility] chosen - agency contacted and referral accepted.  [facility] confirms that pt will go home with portable oxygen concentrator therefore pt does not need to wait for home delivery of equipment before discharge.  Delivery for home equipment will be made arranged with pt/husband.    No other CM needs determined at this time - CM signing off  Final next level of care: Home/Self Care Barriers to Discharge: Barriers Resolved   Patient Goals and CMS Choice   CMS Medicare.gov Compare Post Acute Care list provided to:: Patient Choice offered to / list presented to : Patient  Discharge Placement                       Discharge Plan and Services                DME Arranged: Oxygen DME Agency: [facility] Date DME Agency Contacted: 06/04/19 Time DME Agency Contacted: 1100 Representative spoke with at DME Agency: [place] Interventions     Readmission Risk Interventions No flowsheet data found.

## 2019-06-04 NOTE — Progress Notes (Signed)
Occupational Therapy Treatment Patient Details Name: Joan Allen MRN: [id number] DOB: 03/03/1976 Today's Date: 06/04/2019    History of present illness 44 y.o. female with history of hyperlipidemia presents to the ER with complaint of persistent generalized body ache with fever and chills. Patient admitted for SIRS secondary to COVID-19 pneumonia.   OT comments  Pt progressing towards OT goals and is anticipating d/c home today. Focus of session on energy conservation strategies during ADL/iADL/mobility completion with handout issued. Additional education provided regarding activity progression after return home as pt still requiring supplemental O2. Pt verbalizing understanding throughout session, completed dressing ADL prior to session in preparation for discharge home. Will continue per POC at this time.   Follow Up Recommendations  Home health OT;Supervision/Assistance - 24 hour    Equipment Recommendations  None recommended by OT          Precautions / Restrictions Precautions Precautions: Other (comment) Precaution Comments: watch sats Restrictions Weight Bearing Restrictions: No       Mobility Bed Mobility               General bed mobility comments: pt seated EOB upon arrival  Transfers                      Balance Overall balance assessment: Needs assistance Sitting-balance support: Feet supported Sitting balance-Leahy Scale: Good     Standing balance support: Single extremity supported;During functional activity Standing balance-Leahy Scale: Fair                             ADL either performed or assessed with clinical judgement   ADL Overall ADL's : Needs assistance/impaired                                   Tub/Shower Transfer Details (indicate cue type and reason): encouraged use of built in shower seat during bathing ADL as way to conserve energy and for increased safety   General ADL Comments: focus  of session on energy conservation strategies during ADL/iADL tasks in preparation for discharge home; corresponding handout issued and reviewed during session with pt verbalizing understanding. pt already dressed in preparation for discharge. also discussed use of pulse ox at home to self-monitor O2 saturation/needs especially with activity. pt verbalizing understanding throughout                        Cognition Arousal/Alertness: Awake/alert Behavior During Therapy: WFL for tasks assessed/performed Overall Cognitive Status: Within Functional Limits for tasks assessed                                               Pertinent Vitals/ Pain       Pain Assessment: No/denies pain     Prior Functioning/Environment              Frequency  Min 2X/week        Progress Toward Goals  OT Goals(current goals can now be found in the care plan section)  Progress towards OT goals: Progressing toward goals  Acute Rehab OT Goals Patient Stated Goal: home OT Goal Formulation: With patient Time For Goal Achievement: 06/17/19 Potential to Achieve Goals: Good ADL Goals Pt Will Perform Grooming:  with modified independence;standing Pt Will Perform Lower Body Bathing: with modified independence;sit to/from stand Pt Will Perform Upper Body Dressing: with modified independence;sitting Pt Will Perform Lower Body Dressing: with modified independence;sit to/from stand Pt Will Transfer to Toilet: with modified independence;ambulating Pt Will Perform Toileting - Clothing Manipulation and hygiene: with modified independence;sit to/from stand Pt/caregiver will Perform Home Exercise Program: Increased strength;Both right and left upper extremity;With theraband;With written HEP provided;Independently Additional ADL Goal #1: Pt will independently verbalize/demonstrate at least 3 energy conservation techniques to utilize during functional task.  Plan Discharge plan remains  appropriate    Co-evaluation                 AM-PAC OT "6 Clicks" Daily Activity     Outcome Measure   Help from another person eating meals?: None Help from another person taking care of personal grooming?: None Help from another person toileting, which includes using toliet, bedpan, or urinal?: None Help from another person bathing (including washing, rinsing, drying)?: A Little Help from another person to put on and taking off regular upper body clothing?: None Help from another person to put on and taking off regular lower body clothing?: A Little 6 Click Score: 22    End of Session    OT Visit Diagnosis: Unsteadiness on feet (R26.81);Muscle weakness (generalized) (M62.81);Other (comment)(decreased activity tolerance)   Activity Tolerance Patient tolerated treatment well   Patient Left with call bell/phone within reach;Other (comment)(seated EOB with RN entering room)   Nurse Communication Mobility status        Time: 3888-2800 OT Time Calculation (min): 11 min  Charges: OT General Charges $OT Visit: 1 Visit OT Treatments $Self Care/Home Management : 8-22 mins  Lou Cal, OT Supplemental Rehabilitation Services Pager [phone] Office [phone]    Raymondo Band 06/04/2019, 3:23 PM

## 2019-06-05 LAB — CULTURE, BLOOD (ROUTINE X 2): Culture: NO GROWTH

## 2019-06-06 LAB — CULTURE, BLOOD (ROUTINE X 2): Culture: NO GROWTH

## 2019-06-07 ENCOUNTER — Encounter (INDEPENDENT_AMBULATORY_CARE_PROVIDER_SITE_OTHER): Payer: Self-pay

## 2019-07-05 DIAGNOSIS — U071 COVID-19: Secondary | ICD-10-CM | POA: Diagnosis not present

## 2019-07-20 ENCOUNTER — Telehealth: Payer: Self-pay

## 2019-07-20 NOTE — Telephone Encounter (Signed)
Called patient to do their pre-visit COVID screening.  Call went to voicemail. Unable to do prescreening.  

## 2019-07-21 ENCOUNTER — Encounter: Payer: Self-pay | Admitting: Internal Medicine

## 2019-07-21 ENCOUNTER — Ambulatory Visit (INDEPENDENT_AMBULATORY_CARE_PROVIDER_SITE_OTHER): Payer: BC Managed Care – PPO | Admitting: Internal Medicine

## 2019-07-21 ENCOUNTER — Other Ambulatory Visit: Payer: Self-pay

## 2019-07-21 VITALS — BP 136/95 | HR 82 | Temp 97.3°F | Resp 17 | Wt 239.8 lb

## 2019-07-21 DIAGNOSIS — R7303 Prediabetes: Secondary | ICD-10-CM

## 2019-07-21 DIAGNOSIS — I10 Essential (primary) hypertension: Secondary | ICD-10-CM | POA: Diagnosis not present

## 2019-07-21 DIAGNOSIS — E785 Hyperlipidemia, unspecified: Secondary | ICD-10-CM

## 2019-07-21 DIAGNOSIS — Z7689 Persons encountering health services in other specified circumstances: Secondary | ICD-10-CM

## 2019-07-21 NOTE — Patient Instructions (Addendum)
Thank you for choosing Primary Care at [facility] to be your medical home!    Joan Allen was seen by De Hollingshead, DO today.   Godfrey Pick Eilts's primary care provider is Arvilla Market, DO.   For the best care possible, you should try to see Marcy Siren, DO whenever you come to the clinic.   We look forward to seeing you again soon!  If you have any questions about your visit today, please call us at [phone] or feel free to reach your primary care provider via MyChart.    DASH Eating Plan DASH stands for "Dietary Approaches to Stop Hypertension." The DASH eating plan is a healthy eating plan that has been shown to reduce high blood pressure (hypertension). It may also reduce your risk for type 2 diabetes, heart disease, and stroke. The DASH eating plan may also help with weight loss. What are tips for following this plan?  General guidelines  Avoid eating more than 2,300 mg (milligrams) of salt (sodium) a day. If you have hypertension, you may need to reduce your sodium intake to 1,500 mg a day.  Limit alcohol intake to no more than 1 drink a day for nonpregnant women and 2 drinks a day for men. One drink equals 12 oz of beer, 5 oz of wine, or 1 oz of hard liquor.  Work with your health care provider to maintain a healthy body weight or to lose weight. Ask what an ideal weight is for you.  Get at least 30 minutes of exercise that causes your heart to beat faster (aerobic exercise) most days of the week. Activities may include walking, swimming, or biking.  Work with your health care provider or diet and nutrition specialist (dietitian) to adjust your eating plan to your individual calorie needs. Reading food labels   Check food labels for the amount of sodium per serving. Choose foods with less than 5 percent of the Daily Value of sodium. Generally, foods with less than 300 mg of sodium per serving fit into this eating plan.  To find whole  grains, look for the word "whole" as the first word in the ingredient list. Shopping  Buy products labeled as "low-sodium" or "no salt added."  Buy fresh foods. Avoid canned foods and premade or frozen meals. Cooking  Avoid adding salt when cooking. Use salt-free seasonings or herbs instead of table salt or sea salt. Check with your health care provider or pharmacist before using salt substitutes.  Do not fry foods. Cook foods using healthy methods such as baking, boiling, grilling, and broiling instead.  Cook with heart-healthy oils, such as olive, canola, soybean, or sunflower oil. Meal planning  Eat a balanced diet that includes: ? 5 or more servings of fruits and vegetables each day. At each meal, try to fill half of your plate with fruits and vegetables. ? Up to 6-8 servings of whole grains each day. ? Less than 6 oz of lean meat, poultry, or fish each day. A 3-oz serving of meat is about the same size as a deck of cards. One egg equals 1 oz. ? 2 servings of low-fat dairy each day. ? A serving of nuts, seeds, or beans 5 times each week. ? Heart-healthy fats. Healthy fats called Omega-3 fatty acids are found in foods such as flaxseeds and coldwater fish, like sardines, salmon, and mackerel.  Limit how much you eat of the following: ? Canned or prepackaged foods. ? Food that is high in  trans fat, such as fried foods. ? Food that is high in saturated fat, such as fatty meat. ? Sweets, desserts, sugary drinks, and other foods with added sugar. ? Full-fat dairy products.  Do not salt foods before eating.  Try to eat at least 2 vegetarian meals each week.  Eat more home-cooked food and less restaurant, buffet, and fast food.  When eating at a restaurant, ask that your food be prepared with less salt or no salt, if possible. What foods are recommended? The items listed may not be a complete list. Talk with your dietitian about what dietary choices are best for  you. Grains Whole-grain or whole-wheat bread. Whole-grain or whole-wheat pasta. Brown rice. [place]. Bulgur. Whole-grain and low-sodium cereals. Pita bread. Low-fat, low-sodium crackers. Whole-wheat flour tortillas. Vegetables Fresh or frozen vegetables (raw, steamed, roasted, or grilled). Low-sodium or reduced-sodium tomato and vegetable juice. Low-sodium or reduced-sodium tomato sauce and tomato paste. Low-sodium or reduced-sodium canned vegetables. Fruits All fresh, dried, or frozen fruit. Canned fruit in natural juice (without added sugar). Meat and other protein foods Skinless chicken or [place]. Ground chicken or [place]. Pork with fat trimmed off. Fish and seafood. Egg whites. Dried beans, peas, or lentils. Unsalted nuts, nut butters, and seeds. Unsalted canned beans. Lean cuts of beef with fat trimmed off. Low-sodium, lean deli meat. Dairy Low-fat (1%) or fat-free (skim) milk. Fat-free, low-fat, or reduced-fat cheeses. Nonfat, low-sodium ricotta or cottage cheese. Low-fat or nonfat yogurt. Low-fat, low-sodium cheese. Fats and oils Soft margarine without trans fats. Vegetable oil. Low-fat, reduced-fat, or light mayonnaise and salad dressings (reduced-sodium). Canola, safflower, olive, soybean, and sunflower oils. Avocado. Seasoning and other foods Herbs. Spices. Seasoning mixes without salt. Unsalted popcorn and pretzels. Fat-free sweets. What foods are not recommended? The items listed may not be a complete list. Talk with your dietitian about what dietary choices are best for you. Grains Baked goods made with fat, such as croissants, muffins, or some breads. Dry pasta or rice meal packs. Vegetables Creamed or fried vegetables. Vegetables in a cheese sauce. Regular canned vegetables (not low-sodium or reduced-sodium). Regular canned tomato sauce and paste (not low-sodium or reduced-sodium). Regular tomato and vegetable juice (not low-sodium or reduced-sodium). Angie Fava.  Olives. Fruits Canned fruit in a light or heavy syrup. Fried fruit. Fruit in cream or butter sauce. Meat and other protein foods Fatty cuts of meat. Ribs. Fried meat. Berniece Salines. Sausage. Bologna and other processed lunch meats. Salami. Fatback. Hotdogs. Bratwurst. Salted nuts and seeds. Canned beans with added salt. Canned or smoked fish. Whole eggs or egg yolks. Chicken or [place] with skin. Dairy Whole or 2% milk, cream, and half-and-half. Whole or full-fat cream cheese. Whole-fat or sweetened yogurt. Full-fat cheese. Nondairy creamers. Whipped toppings. Processed cheese and cheese spreads. Fats and oils Butter. Stick margarine. Lard. Shortening. Ghee. Bacon fat. Tropical oils, such as coconut, palm kernel, or palm oil. Seasoning and other foods Salted popcorn and pretzels. Onion salt, garlic salt, seasoned salt, table salt, and sea salt. Worcestershire sauce. Tartar sauce. Barbecue sauce. Teriyaki sauce. Soy sauce, including reduced-sodium. Steak sauce. Canned and packaged gravies. Fish sauce. Oyster sauce. Cocktail sauce. Horseradish that you find on the shelf. Ketchup. Mustard. Meat flavorings and tenderizers. Bouillon cubes. Hot sauce and Tabasco sauce. Premade or packaged marinades. Premade or packaged taco seasonings. Relishes. Regular salad dressings. Where to find more information:  National Heart, Lung, and [place]: https://wilson-eaton.com/  American Heart Association: www.heart.org Summary  The DASH eating plan is a healthy eating plan that  has been shown to reduce high blood pressure (hypertension). It may also reduce your risk for type 2 diabetes, heart disease, and stroke.  With the DASH eating plan, you should limit salt (sodium) intake to 2,300 mg a day. If you have hypertension, you may need to reduce your sodium intake to 1,500 mg a day.  When on the DASH eating plan, aim to eat more fresh fruits and vegetables, whole grains, lean proteins, low-fat dairy, and heart-healthy  fats.  Work with your health care provider or diet and nutrition specialist (dietitian) to adjust your eating plan to your individual calorie needs. This information is not intended to replace advice given to you by your health care provider. Make sure you discuss any questions you have with your health care provider. Document Revised: 05/17/2017 Document Reviewed: 05/28/2016 Elsevier Patient Education  2020 [employer].

## 2019-07-21 NOTE — Progress Notes (Addendum)
  Subjective:    Joan Allen - 44 y.o. female MRN [id number]  Date of birth: [birth year]  HPI  Joan Allen is to establish care. Patient has a PMH significant for HTN, Hyperlipidemia, Pre-Diabetes.   Chronic HTN Disease Monitoring:  Home BP Monitoring - Does not monitor  Chest pain- no  Dyspnea- no Headache - no  Medications: Has never taken any medications.  Compliance- N/A Lightheadedness- no  Edema- no   Pre-Diabetes: A1c 5.9% on 06/02/19. This was while patient was hospitalized for COVID-19 and receiving steroids. Reports her glucose went so high that she needed to receive insulin. Otherwise, no known prior history of pre-diabetes or diabetes.     ROS per HPI    Health Maintenance Due  Topic Date Due  . PAP SMEAR-Modifier  05/05/1995     Past Medical History: Patient Active Problem List   Diagnosis Date Noted  . HTN (hypertension) 10/06/2015  . Hyperlipidemia 10/06/2015      Social History   reports that she has never smoked. She has never used smokeless tobacco. She reports current alcohol use. She reports that she does not use drugs.   Family History  family history includes Cancer in her father and maternal grandmother; Diabetes in her maternal grandfather; Hypertension in her mother; Kidney Stones in her mother; Stroke in her maternal grandfather.   Medications: reviewed and updated   Objective:   Physical Exam BP (!) 136/95   Pulse 82   Temp (!) 97.3 F (36.3 C) (Temporal)   Resp 17   Wt 239 lb 12.8 oz (108.8 kg)   SpO2 98%   BMI 33.45 kg/m  Physical Exam  Constitutional: She is oriented to person, place, and time and well-developed, well-nourished, and in no distress. No distress.  HENT:  Head: Normocephalic and atraumatic.  Eyes: Conjunctivae and EOM are normal.  Cardiovascular: Normal rate, regular rhythm and normal heart sounds.  No murmur heard. Pulmonary/Chest: Effort normal and breath sounds normal. No  respiratory distress.  Musculoskeletal:        General: Normal range of motion.  Neurological: She is alert and oriented to person, place, and time.  Skin: Skin is warm and dry. She is not diaphoretic.  Psychiatric: Affect and judgment normal.        Assessment & Plan:    1. Encounter to establish care Patient reports UTD on PAP at outside [facility] office; obtain records.   2. Prediabetes Will repeat A1c today. Suspect will have improvement now that she is no longer receiving high dose steroids and has recovered from acute illness.  - Hemoglobin A1c  3. Essential hypertension BP borderline today. Reviewed prior BPs and seems to range 120-130/80-90. Will continue to monitor.  Counseled on low-sodium, DASH diet, 150 minutes of moderate intensity exercise per week.  4. Hyperlipidemia, unspecified hyperlipidemia type Continue statin.  - Lipid Panel     Marcy Siren, D.O. 07/21/2019, 3:55 PM Primary Care at [facility]

## 2019-07-22 LAB — LIPID PANEL
Chol/HDL Ratio: 5.1 ratio — ABNORMAL HIGH (ref 0.0–4.4)
Cholesterol, Total: 183 mg/dL (ref 100–199)
HDL: 36 mg/dL — ABNORMAL LOW (ref >39)
LDL Chol Calc (NIH): 121 mg/dL — ABNORMAL HIGH (ref 0–99)
Triglycerides: 146 mg/dL (ref 0–149)
VLDL Cholesterol Cal: 26 mg/dL (ref 5–40)

## 2019-07-22 LAB — HEMOGLOBIN A1C
Est. average glucose Bld gHb Est-mCnc: 114 mg/dL
Hgb A1c MFr Bld: 5.6 % (ref 4.8–5.6)

## 2019-08-27 ENCOUNTER — Other Ambulatory Visit: Payer: Self-pay

## 2019-08-27 ENCOUNTER — Encounter: Payer: Self-pay | Admitting: Cardiovascular Disease

## 2019-08-27 ENCOUNTER — Ambulatory Visit (INDEPENDENT_AMBULATORY_CARE_PROVIDER_SITE_OTHER): Payer: BC Managed Care – PPO | Admitting: Cardiovascular Disease

## 2019-08-27 VITALS — BP 118/88 | HR 84 | Ht 71.0 in | Wt 244.0 lb

## 2019-08-27 DIAGNOSIS — I10 Essential (primary) hypertension: Secondary | ICD-10-CM | POA: Diagnosis not present

## 2019-08-27 DIAGNOSIS — E782 Mixed hyperlipidemia: Secondary | ICD-10-CM

## 2019-08-27 MED ORDER — ATORVASTATIN CALCIUM 40 MG PO TABS: 40.0000 mg | ORAL_TABLET | Freq: Every day | ORAL | 3 refills | Status: DC

## 2019-08-27 NOTE — Patient Instructions (Signed)
Medication Instructions:  The current medical regimen is effective;  continue present plan and medications.  *If you need a refill on your cardiac medications before your next appointment, please call your pharmacy*  Follow-Up: At [facility], you and your health needs are our priority.  As part of our continuing mission to provide you with exceptional heart care, we have created designated Provider Care Teams.  These Care Teams include your primary Cardiologist (physician) and Advanced Practice Providers (APPs -  Physician Assistants and Nurse Practitioners) who all work together to provide you with the care you need, when you need it.  We recommend signing up for the patient portal called "MyChart".  Sign up information is provided on this After Visit Summary.  MyChart is used to connect with patients for Virtual Visits (Telemedicine).  Patients are able to view lab/test results, encounter notes, upcoming appointments, etc.  Non-urgent messages can be sent to your provider as well.   To learn more about what you can do with MyChart, go to [email or web address].    Your next appointment:   12 month(s)  The format for your next appointment:   In Person  Provider:   Philip Nahser, MD  Thank you for choosing [facility]!!     

## 2019-08-27 NOTE — Progress Notes (Signed)
Cardiology Office Note   Date:  08/27/2019   ID:  Joan Allen, DOB 1976/01/13, MRN [id number]  PCP:  Arvilla Market, DO  Cardiologist:   Kristeen Miss, MD   Chief Complaint  Patient presents with  . Hyperlipidemia   Problem list 1. Hyperlipidemia.   October 06, 2015   Joan Allen is a 44 y.o. female who presents for further evaluation of her hyperlipidemia.  She's had elevated cholesterol level since her 20s.  She's never been put on any medications. She's tried to improve her diet. No CP with exertion.   Does have some DOE . No DOE doing normal household activities.   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  January 05, 2016  Joan Allen is seen today for follow up  Doing well. Walking some  Work is stressful  Trying to watch her diet   Aug. 24, 2018:  Joan Allen is seen today for follow up of hyperlipidemia  Has not been exercising much  No CP or dyspnea  April 17, 2018: Joan Allen seen today for follow-up visit.  She has a history of hyperlipidemia. Is getting some exercise  Walks the dog - no exercise except for that  Wt today is 242.  ( previous weight is 241 )  No Cp or dyspnea.     August 27, 2019:  Joan Allen is seen today for follow up of her hyperlipidemia and obesity Wt today is 244 lbs.  Had covid - was hospitalized in Dec. For 5 days .  Is not exercising .    Encouraged her to exercise   Past Medical History:  Diagnosis Date  . Abnormal cholesterol test   . Body mass index 32.0-32.9, adult   . COVID-19 virus infection 05/31/2019  . Decreased libido   . Follicular cyst of ovary   . History of migraine headaches   . Lump or mass in breast   . Obese   . Surveillance of intrauterine contraceptive device     Past Surgical History:  Procedure Laterality Date  . NO PAST SURGERIES       Current Outpatient Medications  Medication Sig Dispense Refill  . atorvastatin  (LIPITOR) 40 MG tablet Take 1 tablet (40 mg total) by mouth daily. 90 tablet 3  . levonorgestrel (MIRENA) 20 MCG/24HR IUD 1 each by Intrauterine route once.     No current facility-administered medications for this visit.    Allergies:   Patient has no known allergies.    Social History:  The patient  reports that she has never smoked. She has never used smokeless tobacco. She reports current alcohol use. She reports that she does not use drugs.   Family History:  The patient's family history includes Cancer in her father and maternal grandmother; Diabetes in her maternal grandfather; Hypertension in her mother; Kidney Stones in her mother; Stroke in her maternal grandfather.    ROS:  Please see the history of present illness.      EKG:    August 27, 2019: Normal sinus rhythm at 84.  No ST or T wave changes.    Recent Labs: 06/04/2019: ALT 37; BUN 15; Creatinine, Ser 0.70; Hemoglobin 13.1; Platelets 244; Potassium 4.1; Sodium 140    Lipid Panel    Component Value Date/Time   CHOL 183 07/21/2019 1445   TRIG 146 07/21/2019 1445   HDL 36 (L) 07/21/2019 1445   CHOLHDL 5.1 (H)  07/21/2019 1445   CHOLHDL 3.7 01/05/2016 0843   VLDL 29 01/05/2016 0843   LDLCALC 121 (H) 07/21/2019 1445      Wt Readings from Last 3 Encounters:  07/21/19 239 lb 12.8 oz (108.8 kg)  06/01/19 242 lb 1 oz (109.8 kg)  04/17/18 242 lb (109.8 kg)      Other studies Reviewed: Additional studies/ records that were reviewed today include: . Review of the above records demonstrates:    ASSESSMENT AND PLAN:  1.  Hyperlipidemia :    Lipid levels look okay.  LDL is 121.  I would like for her to stay on her atorvastatin 40 mg a day.  I encouraged her to work on a diet, exercise, weight loss program.  2. Hypertension:    Blood pressures well controlled..  3. Obesity :      She is gained another couple of pounds over the past year.  I encouraged her to work on a better diet and work on an exercise  program.  I will see her again in 1 year.  Current medicines are reviewed at length with the patient today.  The patient does not have concerns regarding medicines.  The following changes have been made:  no change  Labs/ tests ordered today include:  No orders of the defined types were placed in this encounter.   Mertie Moores, MD  08/27/2019 7:06 AM    [address], [place], [place]  [address] Phone: [phone]; Fax: [phone]

## 2019-10-19 ENCOUNTER — Ambulatory Visit: Payer: BC Managed Care – PPO | Admitting: Internal Medicine

## 2019-10-20 ENCOUNTER — Telehealth: Payer: Self-pay

## 2019-10-20 NOTE — Telephone Encounter (Signed)
Called patient to do their pre-visit COVID screening.  Call went to voicemail. Unable to do prescreening.  

## 2019-10-21 ENCOUNTER — Encounter: Payer: Self-pay | Admitting: Internal Medicine

## 2019-10-21 ENCOUNTER — Ambulatory Visit (INDEPENDENT_AMBULATORY_CARE_PROVIDER_SITE_OTHER): Payer: BC Managed Care – PPO | Admitting: Internal Medicine

## 2019-10-21 ENCOUNTER — Other Ambulatory Visit: Payer: Self-pay

## 2019-10-21 VITALS — BP 124/85 | HR 86 | Temp 97.3°F | Resp 17 | Wt 243.0 lb

## 2019-10-21 DIAGNOSIS — I10 Essential (primary) hypertension: Secondary | ICD-10-CM

## 2019-10-21 NOTE — Progress Notes (Signed)
  Subjective:    Joan Allen - 45 y.o. female MRN [id number]  Date of birth: 09-07-1975  HPI  Joan Allen is here for follow up on HTN. Patient has a diagnosis of HTN but has never been on any medications. She denies chest pain, vision changes, severe headaches, SOB, edema.    Health Maintenance:  Health Maintenance Due  Topic Date Due  . COVID-19 Vaccine (1) Never done  . PAP SMEAR-Modifier  Never done    -  reports that she has never smoked. She has never used smokeless tobacco. - Review of Systems: Per HPI. - Past Medical History: Patient Active Problem List   Diagnosis Date Noted  . HTN (hypertension) 10/06/2015  . Hyperlipidemia 10/06/2015   - Medications: reviewed and updated   Objective:   Physical Exam BP 124/85   Pulse 86   Temp (!) 97.3 F (36.3 C) (Temporal)   Resp 17   Wt 243 lb (110.2 kg)   SpO2 96%   BMI 33.89 kg/m  Physical Exam  Constitutional: She is oriented to person, place, and time and well-developed, well-nourished, and in no distress. No distress.  HENT:  Head: Normocephalic and atraumatic.  Eyes: Conjunctivae and EOM are normal.  Cardiovascular: Normal rate, regular rhythm and normal heart sounds.  No murmur heard. Pulmonary/Chest: Effort normal and breath sounds normal. No respiratory distress.  Musculoskeletal:        General: Normal range of motion.  Neurological: She is alert and oriented to person, place, and time.  Skin: Skin is warm and dry. She is not diaphoretic.  Psychiatric: Affect and judgment normal.           Assessment & Plan:   1. Essential hypertension BP well controlled without any medications. Continue to monitor. Counseled on 150 minutes of exercise per week, healthy eating (including decreased daily intake of saturated fats, cholesterol, added sugars, sodium).    Marcy Siren, D.O. 10/21/2019, 3:39 PM Primary Care at [facility]

## 2020-03-09 ENCOUNTER — Other Ambulatory Visit: Payer: Self-pay | Admitting: Obstetrics and Gynecology

## 2020-03-09 DIAGNOSIS — Z1231 Encounter for screening mammogram for malignant neoplasm of breast: Secondary | ICD-10-CM

## 2020-04-04 DIAGNOSIS — H26491 Other secondary cataract, right eye: Secondary | ICD-10-CM | POA: Diagnosis not present

## 2020-04-04 DIAGNOSIS — H26492 Other secondary cataract, left eye: Secondary | ICD-10-CM | POA: Diagnosis not present

## 2020-04-07 ENCOUNTER — Other Ambulatory Visit: Payer: Self-pay

## 2020-04-07 ENCOUNTER — Ambulatory Visit
Admission: RE | Admit: 2020-04-07 | Discharge: 2020-04-07 | Disposition: A | Discharge status: Home / Self Care | Payer: BC Managed Care – PPO | Source: Ambulatory Visit | Attending: Obstetrics and Gynecology | Admitting: Obstetrics and Gynecology

## 2020-04-07 DIAGNOSIS — Z1231 Encounter for screening mammogram for malignant neoplasm of breast: Secondary | ICD-10-CM

## 2020-04-08 DIAGNOSIS — Z30431 Encounter for routine checking of intrauterine contraceptive device: Secondary | ICD-10-CM | POA: Diagnosis not present

## 2020-04-08 DIAGNOSIS — Z124 Encounter for screening for malignant neoplasm of cervix: Secondary | ICD-10-CM | POA: Diagnosis not present

## 2020-04-08 DIAGNOSIS — Z01419 Encounter for gynecological examination (general) (routine) without abnormal findings: Secondary | ICD-10-CM | POA: Diagnosis not present

## 2020-04-11 DIAGNOSIS — H26491 Other secondary cataract, right eye: Secondary | ICD-10-CM | POA: Diagnosis not present

## 2021-02-24 DIAGNOSIS — R350 Frequency of micturition: Secondary | ICD-10-CM | POA: Diagnosis not present

## 2021-02-24 DIAGNOSIS — R109 Unspecified abdominal pain: Secondary | ICD-10-CM | POA: Diagnosis not present

## 2021-05-05 ENCOUNTER — Other Ambulatory Visit: Payer: Self-pay | Admitting: Obstetrics and Gynecology

## 2021-05-05 DIAGNOSIS — Z1231 Encounter for screening mammogram for malignant neoplasm of breast: Secondary | ICD-10-CM

## 2021-05-08 ENCOUNTER — Ambulatory Visit
Admission: RE | Admit: 2021-05-08 | Discharge: 2021-05-08 | Disposition: A | Discharge status: Home / Self Care | Payer: BC Managed Care – PPO | Source: Ambulatory Visit | Attending: Obstetrics and Gynecology | Admitting: Obstetrics and Gynecology

## 2021-05-08 ENCOUNTER — Other Ambulatory Visit: Payer: Self-pay

## 2021-05-08 DIAGNOSIS — Z1231 Encounter for screening mammogram for malignant neoplasm of breast: Secondary | ICD-10-CM | POA: Diagnosis not present

## 2021-05-16 DIAGNOSIS — Z01419 Encounter for gynecological examination (general) (routine) without abnormal findings: Secondary | ICD-10-CM | POA: Diagnosis not present

## 2021-05-16 DIAGNOSIS — R1011 Right upper quadrant pain: Secondary | ICD-10-CM | POA: Diagnosis not present

## 2021-05-16 DIAGNOSIS — R102 Pelvic and perineal pain: Secondary | ICD-10-CM | POA: Diagnosis not present

## 2021-05-17 ENCOUNTER — Other Ambulatory Visit: Payer: Self-pay | Admitting: Obstetrics and Gynecology

## 2021-05-17 DIAGNOSIS — R109 Unspecified abdominal pain: Secondary | ICD-10-CM

## 2021-06-05 ENCOUNTER — Ambulatory Visit
Admission: RE | Admit: 2021-06-05 | Discharge: 2021-06-05 | Disposition: A | Discharge status: Home / Self Care | Payer: BC Managed Care – PPO | Source: Ambulatory Visit | Attending: Obstetrics and Gynecology | Admitting: Obstetrics and Gynecology

## 2021-06-05 DIAGNOSIS — R109 Unspecified abdominal pain: Secondary | ICD-10-CM

## 2021-06-05 DIAGNOSIS — K760 Fatty (change of) liver, not elsewhere classified: Secondary | ICD-10-CM | POA: Diagnosis not present

## 2021-09-20 IMAGING — DX DG CHEST 1V PORT
1 series · 1 of 1 positions shown · non-contrast
Comparison: None.

CLINICAL DATA: Pt to triage via [REDACTED]> Pt was tested for COVID 3
days ago but didn't have results. + results per [REDACTED]. C/o fever,
body aches, cough, and SOB since [DATE].

EXAM:
PORTABLE CHEST 1 VIEW

[chest ap]
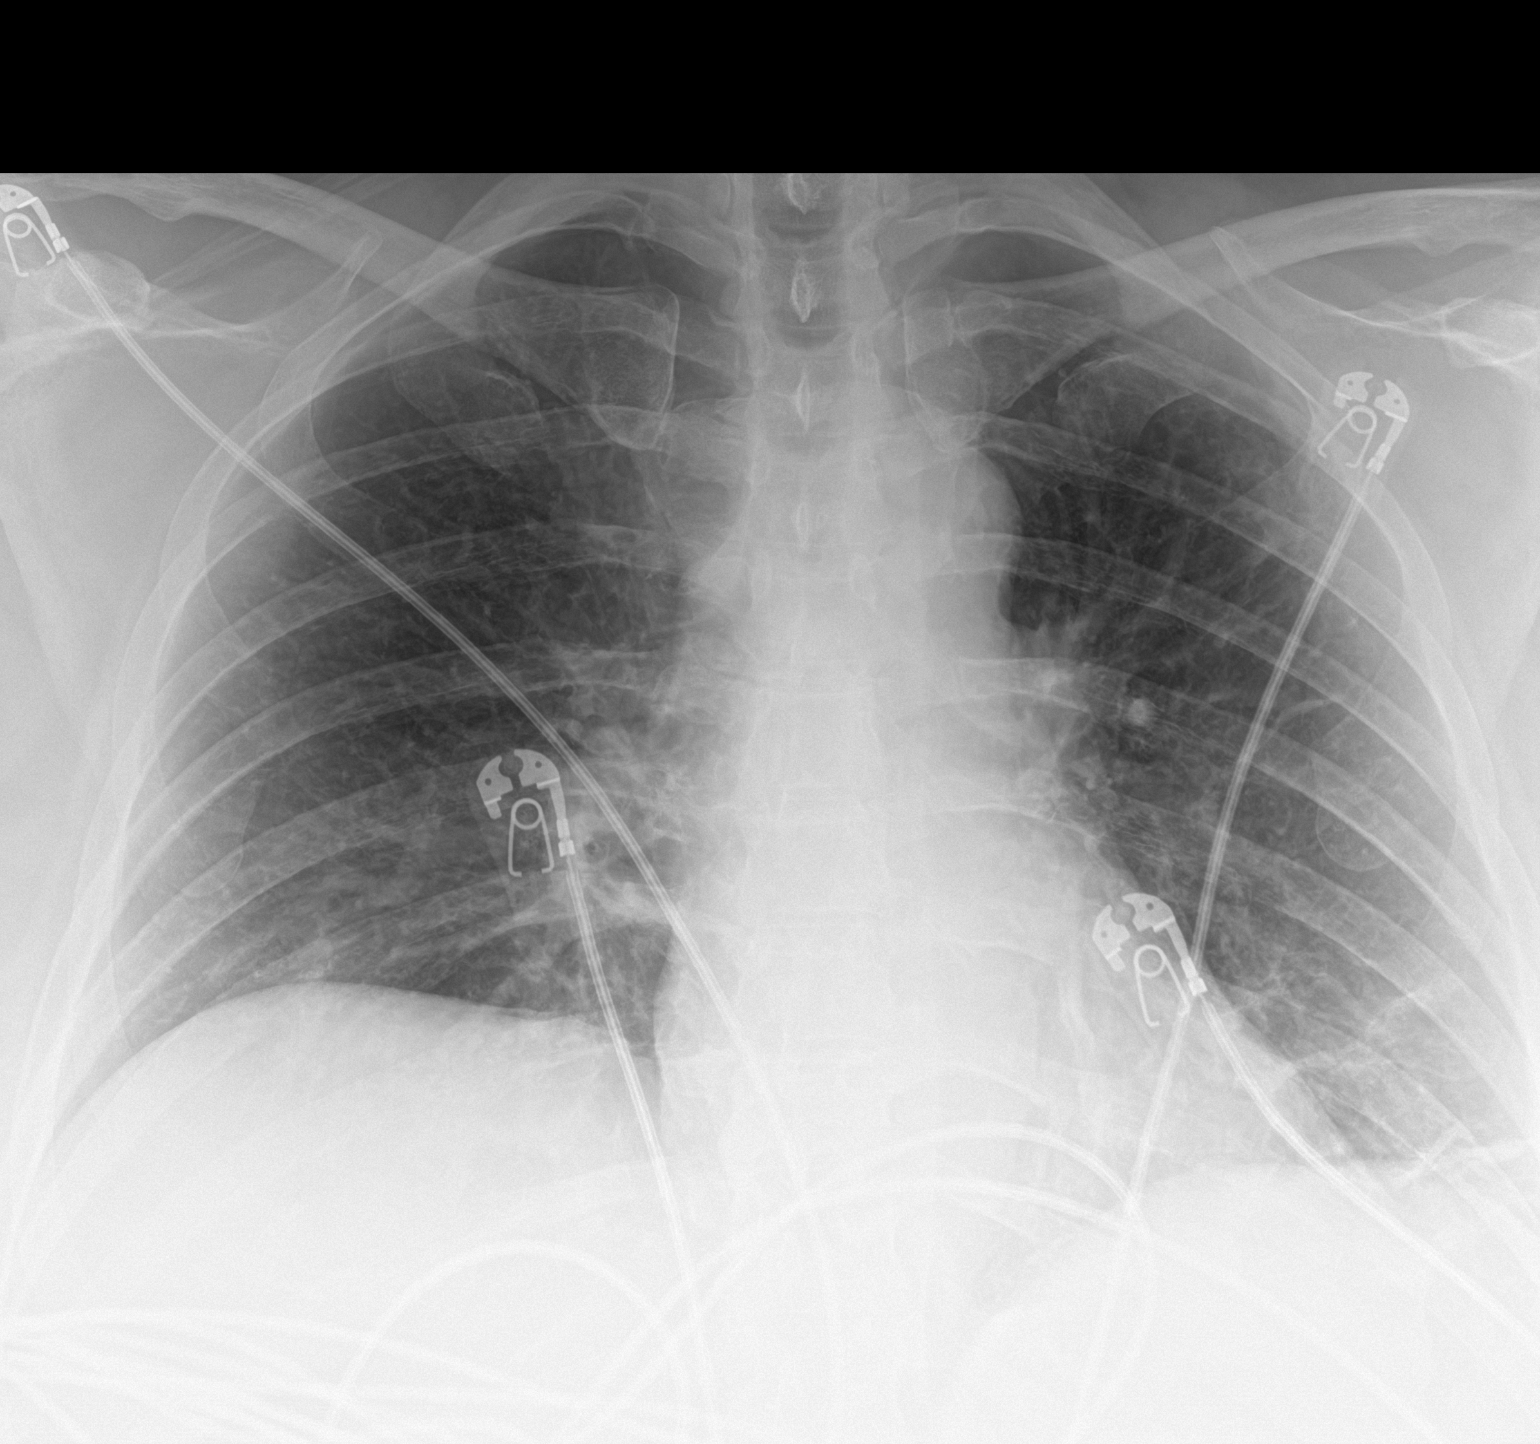

[1 of 1 positions shown; findings below may reference images not displayed]

FINDINGS: The cardiomediastinal contours are within normal limits for AP
technique. Low lung volumes. There are scattered linear opacities in
the bilateral lung bases likely atelectasis. No pneumothorax or
large pleural effusion. No acute finding in the visualized skeleton.
IMPRESSION: Scattered linear opacities in the bilateral lung bases likely
atelectasis.

## 2021-12-20 ENCOUNTER — Encounter: Payer: Self-pay | Admitting: Cardiovascular Disease

## 2021-12-20 NOTE — Progress Notes (Unsigned)
Cardiology Office Note   Date:  12/21/2021   ID:  YLIANNA ALMANZAR, DOB Oct 12, 1975, MRN [id number]  PCP:  Arvilla Market, MD  Cardiologist:   Kristeen Miss, MD   Chief Complaint  Patient presents with   Hyperlipidemia   Problem list 1. Hyperlipidemia.   October 06, 2015   Joan Allen is a 46 y.o. female who presents for further evaluation of her hyperlipidemia.  She's had elevated cholesterol level since her 20s.  She's never been put on any medications. She's tried to improve her diet. No CP with exertion.   Does have some DOE . No DOE doing normal household activities.   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  January 05, 2016  Joan Allen is seen today for follow up  Doing well. Walking some  Work is stressful  Trying to watch her diet   Aug. 24, 2018:  Joan Allen is seen today for follow up of hyperlipidemia  Has not been exercising much  No CP or dyspnea  April 17, 2018: Early seen today for follow-up visit.  She has a history of hyperlipidemia. Is getting some exercise  Walks the dog - no exercise except for that  Wt today is 242.  ( previous weight is 241 )  No Cp or dyspnea.     August 27, 2019:  Joan Allen is seen today for follow up of her hyperlipidemia and obesity Wt today is 244 lbs.  Had covid - was hospitalized in Dec. For 5 days .  Is not exercising .    Encouraged her to exercise   December 21, 2021: Joan Allen is seen today for follow-up of her hyperlipidemia and obesity. Her weight today is  259 lbs - up 15 lbs from 2 years ago   Ran out of her atorva a year ago  Eats take out food every day for lunch  Not getting any regular exercise     Past Medical History:  Diagnosis Date   Abnormal cholesterol test    Body mass index 32.0-32.9, adult    COVID-19 virus infection 05/31/2019   Decreased libido    Follicular cyst of ovary    History of migraine headaches    Lump or  mass in breast    Obese    Surveillance of intrauterine contraceptive device     Past Surgical History:  Procedure Laterality Date   NO PAST SURGERIES       Current Outpatient Medications  Medication Sig Dispense Refill   levonorgestrel (MIRENA) 20 MCG/24HR IUD 1 each by Intrauterine route once.     atorvastatin (LIPITOR) 40 MG tablet Take 1 tablet (40 mg total) by mouth daily. (Patient not taking: Reported on 12/21/2021) 90 tablet 3   No current facility-administered medications for this visit.    Allergies:   Patient has no known allergies.    Social History:  The patient  reports that she has never smoked. She has never used smokeless tobacco. She reports current alcohol use. She reports that she does not use drugs.   Family History:  The patient's family history includes Breast cancer in her maternal grandmother; Cancer in her father and maternal grandmother; Diabetes in her maternal grandfather; Hypertension in her mother; Kidney Stones in her mother; Stroke in her maternal grandfather.    ROS:  Please see the history of present illness.   Physical Exam: Blood pressure (!) 155/97, pulse 99,  height 5' 10.5" (1.791 m), weight 259 lb 3.2 oz (117.6 kg), SpO2 98 %.  GEN:  Well nourished, well developed in no acute distress, moderately obese  HEENT: Normal NECK: No JVD; No carotid bruits LYMPHATICS: No lymphadenopathy CARDIAC: RRR , no murmurs, rubs, gallops RESPIRATORY:  Clear to auscultation without rales, wheezing or rhonchi  ABDOMEN: Soft, non-tender, non-distended MUSCULOSKELETAL:  No edema; No deformity  SKIN: Warm and dry NEUROLOGIC:  Alert and oriented x 3    EKG:     December 21, 2021: Normal sinus rhythm at 99.  Normal EKG.    Recent Labs: No results found for requested labs within last 365 days.    Lipid Panel    Component Value Date/Time   CHOL 183 07/21/2019 1445   TRIG 146 07/21/2019 1445   HDL 36 (L) 07/21/2019 1445   CHOLHDL 5.1 (H) 07/21/2019 1445    CHOLHDL 3.7 01/05/2016 0843   VLDL 29 01/05/2016 0843   LDLCALC 121 (H) 07/21/2019 1445      Wt Readings from Last 3 Encounters:  12/21/21 259 lb 3.2 oz (117.6 kg)  10/21/19 243 lb (110.2 kg)  08/27/19 244 lb (110.7 kg)      Other studies Reviewed: Additional studies/ records that were reviewed today include: . Review of the above records demonstrates:    ASSESSMENT AND PLAN:  1.  Hyperlipidemia :     refill atorvastatin   2. Hypertension:     BP is elevated . Ad Losartan 50 mg a day , HCTZ 25 mg a day , kdur 10 meq a day   3. Obesity :      she knows she needs to lose weight.     Current medicines are reviewed at length with the patient today.  The patient does not have concerns regarding medicines.  The following changes have been made:  no change  Labs/ tests ordered today include:  No orders of the defined types were placed in this encounter.   Kristeen Miss, MD  12/21/2021 3:50 PM    [facility] [address] [place], [place], [place]  [address] Phone: [phone]; Fax: [phone]

## 2021-12-21 ENCOUNTER — Encounter: Payer: Self-pay | Admitting: Cardiovascular Disease

## 2021-12-21 ENCOUNTER — Ambulatory Visit (INDEPENDENT_AMBULATORY_CARE_PROVIDER_SITE_OTHER): Payer: BC Managed Care – PPO | Admitting: Cardiovascular Disease

## 2021-12-21 VITALS — BP 155/97 | HR 99 | Ht 70.5 in | Wt 259.2 lb

## 2021-12-21 DIAGNOSIS — E785 Hyperlipidemia, unspecified: Secondary | ICD-10-CM

## 2021-12-21 MED ORDER — HYDROCHLOROTHIAZIDE 25 MG PO TABS: 25.0000 mg | ORAL_TABLET | Freq: Every day | ORAL | 3 refills | Status: DC

## 2021-12-21 MED ORDER — ATORVASTATIN CALCIUM 40 MG PO TABS: 40.0000 mg | ORAL_TABLET | Freq: Every day | ORAL | 3 refills | Status: DC

## 2021-12-21 MED ORDER — POTASSIUM CHLORIDE ER 10 MEQ PO TBCR: 10.0000 meq | EXTENDED_RELEASE_TABLET | Freq: Every day | ORAL | 3 refills | Status: DC

## 2021-12-21 MED ORDER — LOSARTAN POTASSIUM 50 MG PO TABS: 50.0000 mg | ORAL_TABLET | Freq: Every day | ORAL | 3 refills | Status: DC

## 2021-12-21 NOTE — Patient Instructions (Signed)
Medication Instructions:  Your physician has recommended you make the following change in your medication:  Start: Losartan 50mg  daily Hydrochlorothiazide 25mg  daily K Dur  (Potassium) daily Atorvastatin 40mg  daily  *If you need a refill on your cardiac medications before your next appointment, please call your pharmacy*   Lab Work: Lipids, BMP and ALT in 2 months.  If you have labs (blood work) drawn today and your tests are completely normal, you will receive your results only by: MyChart Message (if you have MyChart) OR A paper copy in the mail If you have any lab test that is abnormal or we need to change your treatment, we will call you to review the results.  Follow-Up: At [facility], you and your health needs are our priority.  As part of our continuing mission to provide you with exceptional heart care, we have created designated Provider Care Teams.  These Care Teams include your primary Cardiologist (physician) and Advanced Practice Providers (APPs -  Physician Assistants and Nurse Practitioners) who all work together to provide you with the care you need, when you need it.  We recommend signing up for the patient portal called "MyChart".  Sign up information is provided on this After Visit Summary.  MyChart is used to connect with patients for Virtual Visits (Telemedicine).  Patients are able to view lab/test results, encounter notes, upcoming appointments, etc.  Non-urgent messages can be sent to your provider as well.   To learn more about what you can do with MyChart, go to .    Your next appointment:   6 month(s)  The format for your next appointment:   In Person  Provider:   , PA-C, [facility], NP, or [email or web address], PA-C   :1}   Important Information About Sugar

## 2022-03-01 ENCOUNTER — Ambulatory Visit: Payer: BC Managed Care – PPO | Attending: Cardiovascular Disease

## 2022-03-01 DIAGNOSIS — E785 Hyperlipidemia, unspecified: Secondary | ICD-10-CM | POA: Diagnosis not present

## 2022-03-01 LAB — BASIC METABOLIC PANEL
BUN/Creatinine Ratio: 18 (ref 9–23)
BUN: 15 mg/dL (ref 6–24)
CO2: 25 mmol/L (ref 20–29)
Calcium: 9.6 mg/dL (ref 8.7–10.2)
Chloride: 105 mmol/L (ref 96–106)
Creatinine, Ser: 0.85 mg/dL (ref 0.57–1.00)
Glucose: 121 mg/dL — ABNORMAL HIGH (ref 70–99)
Potassium: 4.2 mmol/L (ref 3.5–5.2)
Sodium: 143 mmol/L (ref 134–144)
eGFR: 85 mL/min/{1.73_m2} (ref >59)

## 2022-03-01 LAB — LIPID PANEL
Chol/HDL Ratio: 4.1 ratio (ref 0.0–4.4)
Cholesterol, Total: 132 mg/dL (ref 100–199)
HDL: 32 mg/dL — ABNORMAL LOW (ref >39)
LDL Chol Calc (NIH): 81 mg/dL (ref 0–99)
Triglycerides: 99 mg/dL (ref 0–149)
VLDL Cholesterol Cal: 19 mg/dL (ref 5–40)

## 2022-03-01 LAB — ALT: ALT: 25 IU/L (ref 0–32)

## 2022-06-13 NOTE — Progress Notes (Signed)
Cardiology Office Note:    Date:  06/15/2022   ID:  Joan Allen, DOB 03/08/1976, MRN [id number]  PCP:  Arvilla Market, MD (Inactive)   [facility] Providers Cardiologist:  Kristeen Miss, MD     Referring MD: Leary Roca*   Chief Complaint: follow-up   History of Present Illness:    Joan Allen is a very pleasant 46 y.o. female with a hx of hyperlipidemia with elevated cholesterol since she was in her 30s, obesity, and hypertension.  Initially established care with cardiology for management of hyperlipidemia. Admitted to unrestricted diet and not exercising consistently.   Last cardiology clinic visit was 12/21/21 with Dr. Elease Hashimoto.  She had run out of her atorvastatin.  BP was elevated at 155/97 and she was started on losartan 50 mg daily HCTZ 25 mg daily and K-Dur 10 milliequivalents daily.  Advised to resume atorvastatin and return in 6 months for follow-up.  Today, she is here alone for follow-up. Reports she has not been checking her BP at home. Asks if any of her current medications could be contributing to shoulder and wrist pain. Pain is worse on right than left but did not occur prior to starting medications in July.  She had previously been on atorvastatin, she thinks at the same dose and did not feel the symptoms in the past. She denies chest pain, shortness of breath, lower extremity edema, fatigue, palpitations, melena, hematuria, hemoptysis, diaphoresis, weakness, presyncope, syncope, orthopnea, and PND.   Past Medical History:  Diagnosis Date   Abnormal cholesterol test    Body mass index 32.0-32.9, adult    COVID-19 virus infection 05/31/2019   Decreased libido    Follicular cyst of ovary    History of migraine headaches    Lump or mass in breast    Obese    Surveillance of intrauterine contraceptive device     Past Surgical History:  Procedure Laterality Date   NO PAST SURGERIES      Current Medications: Current Meds   Medication Sig   atorvastatin (LIPITOR) 40 MG tablet Take 1 tablet (40 mg total) by mouth daily.   hydrochlorothiazide (HYDRODIURIL) 25 MG tablet Take 1 tablet (25 mg total) by mouth daily.   levonorgestrel (MIRENA) 20 MCG/24HR IUD 1 each by Intrauterine route once.   losartan (COZAAR) 50 MG tablet Take 1 tablet (50 mg total) by mouth daily.   potassium chloride (KLOR-CON) 10 MEQ tablet Take 1 tablet (10 mEq total) by mouth daily.     Allergies:   Patient has no known allergies.   Social History   Socioeconomic History   Marital status: Married    Spouse name: Not on file   Number of children: Not on file   Years of education: Not on file   Highest education level: Not on file  Occupational History   Not on file  Tobacco Use   Smoking status: Never   Smokeless tobacco: Never  Substance and Sexual Activity   Alcohol use: Yes    Alcohol/week: 0.0 standard drinks of alcohol    Comment: occassionally   Drug use: No   Sexual activity: Yes  Other Topics Concern   Not on file  Social History Narrative   Not on file   Social Determinants of Health   Financial Resource Strain: Not on file  Food Insecurity: Not on file  Transportation Needs: Not on file  Physical Activity: Not on file  Stress: Not on file  Social Connections: Not  on file     Family History: The patient's family history includes Breast cancer in her maternal grandmother; Cancer in her father and maternal grandmother; Diabetes in her maternal grandfather; Hypertension in her mother; Kidney Stones in her mother; Stroke in her maternal grandfather.  ROS:   Please see the history of present illness.    + right shoulder and wrist pain All other systems reviewed and are negative.  Labs/Other Studies Reviewed:    The following studies were reviewed today:   Recent Labs: 03/01/2022: ALT 25; BUN 15; Creatinine, Ser 0.85; Potassium 4.2; Sodium 143  Recent Lipid Panel    Component Value Date/Time   CHOL 132  03/01/2022 0750   TRIG 99 03/01/2022 0750   HDL 32 (L) 03/01/2022 0750   CHOLHDL 4.1 03/01/2022 0750   CHOLHDL 3.7 01/05/2016 0843   VLDL 29 01/05/2016 0843   LDLCALC 81 03/01/2022 0750     Risk Assessment/Calculations:       Physical Exam:    VS:  BP 124/78   Pulse 92   Ht 5\' 10"  (1.778 m)   Wt 250 lb 12.8 oz (113.8 kg)   SpO2 97%   BMI 35.99 kg/m     Wt Readings from Last 3 Encounters:  06/15/22 250 lb 12.8 oz (113.8 kg)  12/21/21 259 lb 3.2 oz (117.6 kg)  10/21/19 243 lb (110.2 kg)     GEN:  Well nourished, well developed in no acute distress HEENT: Normal NECK: No JVD; No carotid bruits CARDIAC: RRR, no murmurs, rubs, gallops RESPIRATORY:  Clear to auscultation without rales, wheezing or rhonchi  ABDOMEN: Soft, non-tender, non-distended MUSCULOSKELETAL:  No edema; No deformity. 2+ pedal pulses, equal bilaterally SKIN: Warm and dry NEUROLOGIC:  Alert and oriented x 3 PSYCHIATRIC:  Normal affect   EKG:  EKG is not ordered today.    Diagnoses:    1. Hyperlipidemia LDL goal <100   2. Essential hypertension   3. Pain in joint of right shoulder   4. Class 2 severe obesity due to excess calories with serious comorbidity and body mass index (BMI) of 35.0 to 35.9 in adult [facility])    Assessment and Plan:     Joint pain: Having right shoulder and wrist pain since July 2023 when cardiac medications were started including losartan, HCTZ, potassium chloride. She additionally restarted atorvastatin which she had previously stopped. As noted below, will have her hold statin first as most likely culprit of joint pain. I asked her to notify us in 3 to 4 weeks regarding symptom improvement. Will continue to hold one medication at the time until we can identify the cause. If no improvement while holding cardiac medications, would recommend evaluation by PCP or orthopedist.  Hyperlipidemia LDL goal < 100: LDL 81 on 03/01/2022.  She is having right shoulder and wrist pain since July  when atorvastatin was restarted.  Will have her try holding atorvastatin to see if symptoms improve.  If improvement off atorvastatin, would recommend that we try low-dose rosuvastatin. Heart healthy, mostly plant based diet and regular exercise encouraged.   Hypertension: BP is well-controlled. Encouraged her to monitor a few times per week at home.  If joint pain does not improve off atorvastatin, will have her hold HCTZ to see if there is improvement. It will be important for her to know home BP during this time.  Obesity: Encouraged weight loss by following a heart healthy, mostly plant based diet and 150 minutes of moderate intensity exercise each week. Information  on healthy lifestyle given.     Disposition: 1 year with Dr. Acie Fredrickson or APP  Medication Adjustments/Labs and Tests Ordered: Current medicines are reviewed at length with the patient today.  Concerns regarding medicines are outlined above.  No orders of the defined types were placed in this encounter.  No orders of the defined types were placed in this encounter.   Patient Instructions  Medication Instructions:   PLEASE Hold Atorvastatin X 4 weeks and let us know either by calling [phone] or sending a mychart message if your joint pain has stopped.   *If you need a refill on your cardiac medications before your next appointment, please call your pharmacy*   Lab Work:  None ordered.   If you have labs (blood work) drawn today and your tests are completely normal, you will receive your results only by: [place] (if you have MyChart) OR A paper copy in the mail If you have any lab test that is abnormal or we need to change your treatment, we will call you to review the results.   Testing/Procedures:  None ordered.   Follow-Up: At [facility], you and your health needs are our priority.  As part of our continuing mission to provide you with exceptional heart care, we have created designated  Provider Care Teams.  These Care Teams include your primary Cardiologist (physician) and Advanced Practice Providers (APPs -  Physician Assistants and Nurse Practitioners) who all work together to provide you with the care you need, when you need it.  We recommend signing up for the patient portal called "MyChart".  Sign up information is provided on this After Visit Summary.  MyChart is used to connect with patients for Virtual Visits (Telemedicine).  Patients are able to view lab/test results, encounter notes, upcoming appointments, etc.  Non-urgent messages can be sent to your provider as well.   To learn more about what you can do with MyChart, go to [email or web address].    Your next appointment:   1 year(s)  The format for your next appointment:   In Person  Provider:   Mertie Moores, MD    Or Lorenda Peck Other Instructions  Your physician wants you to follow-up in: 1 year with Lorenda Peck or Dr. Acie Fredrickson.  You will receive a reminder letter in the mail two months in advance. If you don't receive a letter, please call our office to schedule the follow-up appointment.   Important Information About Sugar         Signed, Emmaline Life, NP  06/15/2022 4:45 PM    [place]

## 2022-06-15 ENCOUNTER — Ambulatory Visit: Payer: BC Managed Care – PPO | Attending: Nurse Practitioner | Admitting: Nurse Practitioner

## 2022-06-15 ENCOUNTER — Encounter: Payer: Self-pay | Admitting: Nurse Practitioner

## 2022-06-15 VITALS — BP 124/78 | HR 92 | Ht 70.0 in | Wt 250.8 lb

## 2022-06-15 DIAGNOSIS — I10 Essential (primary) hypertension: Secondary | ICD-10-CM | POA: Diagnosis not present

## 2022-06-15 DIAGNOSIS — E785 Hyperlipidemia, unspecified: Secondary | ICD-10-CM | POA: Diagnosis not present

## 2022-06-15 DIAGNOSIS — M25511 Pain in right shoulder: Secondary | ICD-10-CM | POA: Diagnosis not present

## 2022-06-15 DIAGNOSIS — Z6835 Body mass index (BMI) 35.0-35.9, adult: Secondary | ICD-10-CM

## 2022-06-15 NOTE — Patient Instructions (Signed)
Medication Instructions:   PLEASE Hold Atorvastatin X 4 weeks and let us know either by calling [phone] or sending a mychart message if your joint pain has stopped.   *If you need a refill on your cardiac medications before your next appointment, please call your pharmacy*   Lab Work:  None ordered.   If you have labs (blood work) drawn today and your tests are completely normal, you will receive your results only by: MyChart Message (if you have MyChart) OR A paper copy in the mail If you have any lab test that is abnormal or we need to change your treatment, we will call you to review the results.   Testing/Procedures:  None ordered.   Follow-Up: At [facility], you and your health needs are our priority.  As part of our continuing mission to provide you with exceptional heart care, we have created designated Provider Care Teams.  These Care Teams include your primary Cardiologist (physician) and Advanced Practice Providers (APPs -  Physician Assistants and Nurse Practitioners) who all work together to provide you with the care you need, when you need it.  We recommend signing up for the patient portal called "MyChart".  Sign up information is provided on this After Visit Summary.  MyChart is used to connect with patients for Virtual Visits (Telemedicine).  Patients are able to view lab/test results, encounter notes, upcoming appointments, etc.  Non-urgent messages can be sent to your provider as well.   To learn more about what you can do with MyChart, go to [email or web address].    Your next appointment:   1 year(s)  The format for your next appointment:   In Person  Provider:   Kristeen Miss, MD    Or Lebron Conners Other Instructions  Your physician wants you to follow-up in: 1 year with Lebron Conners or Dr. Elease Hashimoto.  You will receive a reminder letter in the mail two months in advance. If you don't receive a letter, please call our office to  schedule the follow-up appointment.   Important Information About Sugar

## 2022-06-25 ENCOUNTER — Other Ambulatory Visit: Payer: Self-pay | Admitting: Obstetrics and Gynecology

## 2022-06-25 DIAGNOSIS — Z1231 Encounter for screening mammogram for malignant neoplasm of breast: Secondary | ICD-10-CM

## 2022-07-18 ENCOUNTER — Ambulatory Visit
Admission: RE | Admit: 2022-07-18 | Discharge: 2022-07-18 | Disposition: A | Discharge status: Home / Self Care | Payer: BC Managed Care – PPO | Source: Ambulatory Visit | Attending: Obstetrics and Gynecology | Admitting: Obstetrics and Gynecology

## 2022-07-18 DIAGNOSIS — Z1231 Encounter for screening mammogram for malignant neoplasm of breast: Secondary | ICD-10-CM | POA: Diagnosis not present

## 2022-08-10 MED ORDER — NEBIVOLOL HCL 5 MG PO TABS: 5.0000 mg | ORAL_TABLET | Freq: Every day | ORAL | 11 refills | Status: DC

## 2022-09-25 DIAGNOSIS — Z01419 Encounter for gynecological examination (general) (routine) without abnormal findings: Secondary | ICD-10-CM | POA: Diagnosis not present

## 2022-09-25 DIAGNOSIS — Z131 Encounter for screening for diabetes mellitus: Secondary | ICD-10-CM | POA: Diagnosis not present

## 2022-09-25 DIAGNOSIS — Z309 Encounter for contraceptive management, unspecified: Secondary | ICD-10-CM | POA: Diagnosis not present

## 2022-09-25 DIAGNOSIS — M2550 Pain in unspecified joint: Secondary | ICD-10-CM | POA: Diagnosis not present

## 2022-09-25 DIAGNOSIS — R5383 Other fatigue: Secondary | ICD-10-CM | POA: Diagnosis not present

## 2022-09-25 DIAGNOSIS — E7800 Pure hypercholesterolemia, unspecified: Secondary | ICD-10-CM | POA: Diagnosis not present

## 2022-10-09 DIAGNOSIS — N870 Mild cervical dysplasia: Secondary | ICD-10-CM | POA: Diagnosis not present

## 2022-10-09 DIAGNOSIS — R877 Abnormal histological findings in specimens from female genital organs: Secondary | ICD-10-CM | POA: Diagnosis not present

## 2022-10-09 DIAGNOSIS — N879 Dysplasia of cervix uteri, unspecified: Secondary | ICD-10-CM | POA: Diagnosis not present

## 2022-11-03 ENCOUNTER — Other Ambulatory Visit: Payer: Self-pay | Admitting: Obstetrics and Gynecology

## 2022-11-05 ENCOUNTER — Encounter (HOSPITAL_BASED_OUTPATIENT_CLINIC_OR_DEPARTMENT_OTHER): Payer: Self-pay | Admitting: Obstetrics and Gynecology

## 2022-11-06 ENCOUNTER — Encounter (HOSPITAL_BASED_OUTPATIENT_CLINIC_OR_DEPARTMENT_OTHER): Payer: Self-pay | Admitting: Obstetrics and Gynecology

## 2022-11-06 NOTE — Progress Notes (Signed)
Spoke w/ via phone for pre-op interview--- pt Lab needs dos---- cbc, istat  , urine preg            Lab results------ current EKG in epic/ chart COVID test -----patient states asymptomatic no test needed Arrive at ------- 1145 on 11-16-2022 NPO after MN NO Solid Food.  Clear liquids from MN until--- 1045 Med rec completed Medications to take morning of surgery ----- lipitor Diabetic medication ----- n/a Patient instructed no nail polish to be worn day of surgery Patient instructed to bring photo id and insurance card day of surgery Patient aware to have Driver (ride ) / caregiver    for 24 hours after surgery -- husband, brian Patient Special Instructions ----- n/a Pre-Op special Instructions ----- n/a Patient verbalized understanding of instructions that were given at this phone interview. Patient denies shortness of breath, chest pain, fever, cough at this phone interview.

## 2022-11-16 ENCOUNTER — Other Ambulatory Visit: Payer: Self-pay

## 2022-11-16 ENCOUNTER — Ambulatory Visit (HOSPITAL_BASED_OUTPATIENT_CLINIC_OR_DEPARTMENT_OTHER): Payer: BC Managed Care – PPO | Admitting: Anesthesiology

## 2022-11-16 ENCOUNTER — Encounter (HOSPITAL_BASED_OUTPATIENT_CLINIC_OR_DEPARTMENT_OTHER): Payer: Self-pay | Admitting: Obstetrics and Gynecology

## 2022-11-16 ENCOUNTER — Ambulatory Visit (HOSPITAL_BASED_OUTPATIENT_CLINIC_OR_DEPARTMENT_OTHER)
Admission: RE | Admit: 2022-11-16 | Discharge: 2022-11-16 | Disposition: A | Discharge status: Home / Self Care | Payer: BC Managed Care – PPO | Attending: Obstetrics and Gynecology | Admitting: Obstetrics and Gynecology

## 2022-11-16 ENCOUNTER — Encounter (HOSPITAL_BASED_OUTPATIENT_CLINIC_OR_DEPARTMENT_OTHER)
Admission: RE | Disposition: A | Discharge status: Home / Self Care | Payer: Self-pay | Source: Home / Self Care | Attending: Obstetrics and Gynecology

## 2022-11-16 DIAGNOSIS — N870 Mild cervical dysplasia: Secondary | ICD-10-CM | POA: Diagnosis not present

## 2022-11-16 DIAGNOSIS — N72 Inflammatory disease of cervix uteri: Secondary | ICD-10-CM | POA: Diagnosis not present

## 2022-11-16 DIAGNOSIS — Z01818 Encounter for other preprocedural examination: Secondary | ICD-10-CM

## 2022-11-16 DIAGNOSIS — A879 Viral meningitis, unspecified: Secondary | ICD-10-CM | POA: Diagnosis not present

## 2022-11-16 DIAGNOSIS — R87612 Low grade squamous intraepithelial lesion on cytologic smear of cervix (LGSIL): Secondary | ICD-10-CM | POA: Diagnosis not present

## 2022-11-16 DIAGNOSIS — Z803 Family history of malignant neoplasm of breast: Secondary | ICD-10-CM | POA: Insufficient documentation

## 2022-11-16 HISTORY — DX: Mixed hyperlipidemia: E78.2

## 2022-11-16 HISTORY — DX: Unspecified abnormal cytological findings in specimens from cervix uteri: R87.619

## 2022-11-16 HISTORY — DX: Essential (primary) hypertension: I10

## 2022-11-16 HISTORY — DX: Presence of spectacles and contact lenses: Z97.3

## 2022-11-16 HISTORY — DX: Prediabetes: R73.03

## 2022-11-16 HISTORY — PX: CERVICAL CONIZATION W/BX: SHX1330

## 2022-11-16 LAB — POCT I-STAT, CHEM 8
BUN: 15 mg/dL (ref 6–20)
Calcium, Ion: 1.02 mmol/L — ABNORMAL LOW (ref 1.15–1.40)
Chloride: 107 mmol/L (ref 98–111)
Creatinine, Ser: 0.7 mg/dL (ref 0.44–1.00)
Glucose, Bld: 103 mg/dL — ABNORMAL HIGH (ref 70–99)
HCT: 38 % (ref 36.0–46.0)
Hemoglobin: 12.9 g/dL (ref 12.0–15.0)
Potassium: 4.6 mmol/L (ref 3.5–5.1)
Sodium: 141 mmol/L (ref 135–145)
TCO2: 25 mmol/L (ref 22–32)

## 2022-11-16 LAB — CBC
HCT: 40.2 % (ref 36.0–46.0)
Hemoglobin: 13.6 g/dL (ref 12.0–15.0)
MCH: 32 pg (ref 26.0–34.0)
MCHC: 33.8 g/dL (ref 30.0–36.0)
MCV: 94.6 fL (ref 80.0–100.0)
Platelets: 190 10*3/uL (ref 150–400)
RBC: 4.25 MIL/uL (ref 3.87–5.11)
RDW: 12.4 % (ref 11.5–15.5)
WBC: 5.4 10*3/uL (ref 4.0–10.5)
nRBC: 0 % (ref 0.0–0.2)

## 2022-11-16 LAB — POCT PREGNANCY, URINE: Preg Test, Ur: NEGATIVE

## 2022-11-16 SURGERY — CONE BIOPSY, CERVIX
Anesthesia: General | Site: Cervix

## 2022-11-16 MED ORDER — FENTANYL CITRATE (PF) 100 MCG/2ML IJ SOLN
INTRAMUSCULAR | Status: AC
  Filled 2022-11-16: qty 2

## 2022-11-16 MED ORDER — PROPOFOL 10 MG/ML IV BOLUS
INTRAVENOUS | Status: AC
  Filled 2022-11-16: qty 20

## 2022-11-16 MED ORDER — IBUPROFEN 800 MG PO TABS: 800.0000 mg | ORAL_TABLET | Freq: Three times a day (TID) | ORAL | 11 refills | Status: DC | PRN

## 2022-11-16 MED ORDER — ACETAMINOPHEN 500 MG PO TABS
1000.0000 mg | ORAL_TABLET | Freq: Once | ORAL | Status: AC
  Administered 2022-11-16: 1000 mg via ORAL

## 2022-11-16 MED ORDER — ACETAMINOPHEN 500 MG PO TABS
ORAL_TABLET | ORAL | Status: AC
  Filled 2022-11-16: qty 2

## 2022-11-16 MED ORDER — LACTATED RINGERS IV SOLN: INTRAVENOUS | Status: DC

## 2022-11-16 MED ORDER — KETOROLAC TROMETHAMINE 30 MG/ML IJ SOLN
INTRAMUSCULAR | Status: DC | PRN
  Administered 2022-11-16: 30 mg via INTRAVENOUS

## 2022-11-16 MED ORDER — ONDANSETRON HCL 4 MG/2ML IJ SOLN
INTRAMUSCULAR | Status: DC | PRN
  Administered 2022-11-16: 4 mg via INTRAVENOUS

## 2022-11-16 MED ORDER — DEXAMETHASONE SODIUM PHOSPHATE 4 MG/ML IJ SOLN
INTRAMUSCULAR | Status: DC | PRN
  Administered 2022-11-16: 10 mg via INTRAVENOUS

## 2022-11-16 MED ORDER — 0.9 % SODIUM CHLORIDE (POUR BTL) OPTIME
TOPICAL | Status: DC | PRN
  Administered 2022-11-16: 500 mL

## 2022-11-16 MED ORDER — IODINE STRONG (LUGOLS) 5 % PO SOLN
ORAL | Status: DC | PRN
  Administered 2022-11-16: .2 mL

## 2022-11-16 MED ORDER — OXYCODONE HCL 5 MG/5ML PO SOLN: 5.0000 mg | Freq: Once | ORAL | Status: DC | PRN

## 2022-11-16 MED ORDER — PROMETHAZINE HCL 25 MG/ML IJ SOLN: 6.2500 mg | INTRAMUSCULAR | Status: DC | PRN

## 2022-11-16 MED ORDER — PROPOFOL 10 MG/ML IV BOLUS
INTRAVENOUS | Status: DC | PRN
  Administered 2022-11-16: 200 mg via INTRAVENOUS
  Administered 2022-11-16: 20 mg via INTRAVENOUS

## 2022-11-16 MED ORDER — OXYCODONE-ACETAMINOPHEN 5-325 MG PO TABS: 1.0000 | ORAL_TABLET | Freq: Four times a day (QID) | ORAL | 0 refills | Status: AC | PRN

## 2022-11-16 MED ORDER — AMISULPRIDE (ANTIEMETIC) 5 MG/2ML IV SOLN: 10.0000 mg | Freq: Once | INTRAVENOUS | Status: DC | PRN

## 2022-11-16 MED ORDER — DEXMEDETOMIDINE HCL IN NACL 80 MCG/20ML IV SOLN
INTRAVENOUS | Status: DC | PRN
  Administered 2022-11-16: 4 ug via INTRAVENOUS

## 2022-11-16 MED ORDER — FERRIC SUBSULFATE (BULK) SOLN
Status: DC | PRN
  Administered 2022-11-16: 1

## 2022-11-16 MED ORDER — VASOPRESSIN 20 UNIT/ML IV SOLN
INTRAVENOUS | Status: DC | PRN
  Administered 2022-11-16: 8 mL via SURGICAL_CAVITY

## 2022-11-16 MED ORDER — KETOROLAC TROMETHAMINE 30 MG/ML IJ SOLN: 30.0000 mg | Freq: Once | INTRAMUSCULAR | Status: DC | PRN

## 2022-11-16 MED ORDER — LIDOCAINE-EPINEPHRINE 1 %-1:100000 IJ SOLN
INTRAMUSCULAR | Status: DC | PRN
  Administered 2022-11-16: 8 mL

## 2022-11-16 MED ORDER — HEMOSTATIC AGENTS (NO CHARGE) OPTIME
TOPICAL | Status: DC | PRN
  Administered 2022-11-16: 1

## 2022-11-16 MED ORDER — OXYCODONE HCL 5 MG PO TABS: 5.0000 mg | ORAL_TABLET | Freq: Once | ORAL | Status: DC | PRN

## 2022-11-16 MED ORDER — MIDAZOLAM HCL 2 MG/2ML IJ SOLN
INTRAMUSCULAR | Status: DC | PRN
  Administered 2022-11-16: 2 mg via INTRAVENOUS

## 2022-11-16 MED ORDER — MIDAZOLAM HCL 2 MG/2ML IJ SOLN
INTRAMUSCULAR | Status: AC
  Filled 2022-11-16: qty 2

## 2022-11-16 MED ORDER — LIDOCAINE HCL (CARDIAC) PF 100 MG/5ML IV SOSY
PREFILLED_SYRINGE | INTRAVENOUS | Status: DC | PRN
  Administered 2022-11-16: 50 mg via INTRAVENOUS

## 2022-11-16 SURGICAL SUPPLY — 27 items
BLADE SURG 11 STRL SS (BLADE) ×1 IMPLANT
CATH ROBINSON RED A/P 16FR (CATHETERS) ×1 IMPLANT
DRSG TELFA 3X8 NADH STRL (GAUZE/BANDAGES/DRESSINGS) IMPLANT
ELECT BALL LEEP 3MM BLK (ELECTRODE) IMPLANT
ELECT BALL LEEP 5MM RED (ELECTRODE) IMPLANT
GAUZE 4X4 16PLY ~~LOC~~+RFID DBL (SPONGE) ×1 IMPLANT
GLOVE BIOGEL PI IND STRL 7.0 (GLOVE) ×1 IMPLANT
GLOVE ECLIPSE 6.5 STRL STRAW (GLOVE) ×1 IMPLANT
GOWN STRL REUS W/TWL LRG LVL3 (GOWN DISPOSABLE) ×1 IMPLANT
HEMOSTAT SURGICEL 2X14 (HEMOSTASIS) IMPLANT
HEMOSTAT SURGICEL 4X8 (HEMOSTASIS) IMPLANT
KIT TURNOVER CYSTO (KITS) ×1 IMPLANT
MANIFOLD NEPTUNE II (INSTRUMENTS) IMPLANT
PACK VAGINAL WOMENS (CUSTOM PROCEDURE TRAY) ×1 IMPLANT
PAD OB MATERNITY 4.3X12.25 (PERSONAL CARE ITEMS) ×1 IMPLANT
PAD PREP 24X48 CUFFED NSTRL (MISCELLANEOUS) ×1 IMPLANT
SCOPETTES 8  STERILE (MISCELLANEOUS) ×1
SCOPETTES 8 STERILE (MISCELLANEOUS) ×1 IMPLANT
SLEEVE SCD COMPRESS KNEE MED (STOCKING) ×1 IMPLANT
SPIKE FLUID TRANSFER (MISCELLANEOUS) IMPLANT
SUT SILK 2 0 SH (SUTURE) IMPLANT
SUT VIC AB 0 CT1 27 (SUTURE) ×1
SUT VIC AB 0 CT1 27XBRD ANBCTR (SUTURE) IMPLANT
SUT VIC AB 0 CT1 36 (SUTURE) ×2 IMPLANT
SYR BULB EAR ULCER 3OZ GRN STR (SYRINGE) IMPLANT
TOWEL OR 17X24 6PK STRL BLUE (TOWEL DISPOSABLE) ×1 IMPLANT
TUBE CONNECTING 12X1/4 (SUCTIONS) IMPLANT

## 2022-11-16 NOTE — Op Note (Unsigned)
NAMEMASIYA, CLAASSEN MEDICAL RECORD NO: [id number] ACCOUNT NO: [id number] DATE OF BIRTH: 1976/05/31 FACILITY: [facility] LOCATION: [facility] PHYSICIAN: Kmya Placide A. 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  PROCEDURE: colposcope-directed cold knife conization, ECC.  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  ANESTHESIA:  General.  SURGEON:  Lilyonna Steidle A. Cherly Hensen, MD  ASSISTANT:  None.  DESCRIPTION OF PROCEDURE:  Under adequate general anesthesia, the patient was placed in dorsal lithotomy position.  She was sterilely prepped and draped in the usual fashion.  Weighted speculum was placed in the vagina.  Sims retractor was placed  anteriorly.  The cervix was inspected. Colposcopy done and Lugol solution was placed, 1:1 dilution of Pitressin was injected circumferentially excluding the 3 and 9 o'clock position. Subsequently 0 Vicryl figure-of-eight sutures were placed at 3 and 9  o'clock.  Using a curved #11 blade, a conization was then performed.  The patient has an IUD in place.  The patient had subsequently endocervical curettage and once this was done, the base was fulgurated and Surgicel soaked with Monsel's was placed in the  conization bed and the lateral sutures were tied across the midline.  All instruments were then removed from the vagina.  SPECIMEN:  Labeled conization of the cervix and ECC was sent to pathology.  ESTIMATED BLOOD LOSS:  Minimal.  COMPLICATIONS:  None.  The patient tolerated the procedure well, was transferred to recovery room in stable condition.       PAA D: 11/16/2022 11:36:25 pm T: 11/16/2022 11:55:00 pm  JOB: [id number]/ [id number]

## 2022-11-16 NOTE — Transfer of Care (Signed)
Immediate Anesthesia Transfer of Care Note  Patient: Joan Allen  Procedure(s) Performed: CONIZATION CERVIX WITH BIOPSY; COLPOSCOPY (Cervix)  Patient Location: PACU  Anesthesia Type:General  Level of Consciousness: awake and patient cooperative  Airway & Oxygen Therapy: Patient Spontanous Breathing and Patient connected to nasal cannula oxygen  Post-op Assessment: Report given to RN and Post -op Vital signs reviewed and stable  Post vital signs: Reviewed and stable  Last Vitals:  Vitals Value Taken Time  BP 122/90 11/16/22 1411  Temp 36.3 C 11/16/22 1411  Pulse 81 11/16/22 1412  Resp 17 11/16/22 1412  SpO2 92 % 11/16/22 1412  Vitals shown include unvalidated device data.  Last Pain:  Vitals:   11/16/22 1201  TempSrc: Oral         Complications: No notable events documented.

## 2022-11-16 NOTE — H&P (Signed)
Joan Allen is an 47 y.o. female. G2P2 MF presents for cold knife conization due to discordancy b/w pathology and pap smear. 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  Colpo directed biopsies were CIN1. No ECC done due to IUD in place. Reviewed case with gyn onc who recommended CKC as oppose to LEEP  Pertinent Gynecological History: Menses:  IUD Bleeding: none Contraception: IUD DES exposure: denies Blood transfusions: none Sexually transmitted diseases: no past history Previous GYN Procedures:  none   Last mammogram: normal Date: 2024 Last pap: abnormal: HSIL  Date: 2024 OB History: G2, P2   Menstrual History: Menarche age: n/a No LMP recorded. (Menstrual status: IUD).    Past Medical History:  Diagnosis Date   Abnormal Pap smear of cervix    History of migraine headaches    Hyperlipidemia, mixed    followed by dr Melburn Popper and pcp   Hypertension    Pre-diabetes    Wears contact lenses    per pt right eye only    Past Surgical History:  Procedure Laterality Date   CATARACT EXTRACTION W/ INTRAOCULAR LENS IMPLANT Bilateral 2018   COLONOSCOPY WITH PROPOFOL  2014    Family History  Problem Relation Age of Onset   Kidney Stones Mother    Hypertension Mother    Cancer Father        MYLOEISARCOMA PERINEUM   Breast cancer Maternal Grandmother    Cancer Maternal Grandmother        BREAST   Stroke Maternal Grandfather    Diabetes Maternal Grandfather     Social History:  reports that she has never smoked. She has never used smokeless tobacco. She reports that she does not currently use alcohol. She reports that she does not use drugs.  Allergies:  Allergies  Allergen Reactions   Fentanyl Nausea And Vomiting    No medications prior to admission.    Review of Systems  All other systems reviewed and are negative.   Height 5' 10.75" (1.797 m), weight 117.9 kg. Physical Exam Constitutional:      Appearance: Normal appearance.  HENT:     Head: Atraumatic.  Eyes:     Extraocular  Movements: Extraocular movements intact.  Cardiovascular:     Rate and Rhythm: Regular rhythm.  Pulmonary:     Breath sounds: Normal breath sounds.  Abdominal:     Palpations: Abdomen is soft.  Genitourinary:    General: Normal vulva.     Comments: Vagina ; nl  Cervix parous Uterus av Adnexa nl Musculoskeletal:        General: Normal range of motion.     Cervical back: Neck supple.  Skin:    General: Skin is warm and dry.  Neurological:     General: No focal deficit present.     Mental Status: She is alert and oriented to person, place, and time.  Psychiatric:        Mood and Affect: Mood normal.        Behavior: Behavior normal.     No results found for this or any previous visit (from the past 24 hour(s)).  No results found.  Assessment/Plan: HSIL pap CIN 1  P) CKC, ECC. Procedure explained. Risk of surgery reviewed including infection, bleeding, injury to surrounding organ structures, margins not clear. All ? answered  Joan Allen 11/16/2022, 10:32 AM

## 2022-11-16 NOTE — Interval H&P Note (Signed)
History and Physical Interval Note:  11/16/2022 12:48 PM  Joan Allen  has presented today for surgery, with the diagnosis of abnormal pap smear of cervix can not exclude HGSIL.  The various methods of treatment have been discussed with the patient and family. After consideration of risks, benefits and other options for treatment, the patient has consented to  Procedure(s): CONIZATION CERVIX WITH BIOPSY (N/A) as a surgical intervention.  The patient's history has been reviewed, patient examined, no change in status, stable for surgery.  I have reviewed the patient's chart and labs.  Questions were answered to the patient's satisfaction.     Macarius Ruark A [name]

## 2022-11-16 NOTE — Discharge Instructions (Addendum)
CALL  IF TEMP>100.4, NOTHING PER VAGINA X 3WK, CALL IF SOAKING A MAXI  PAD EVERY HOUR OR MORE FREQUENTLY Post Anesthesia Home Care Instructions  Activity: Get plenty of rest for the remainder of the day. A responsible adult should stay with you for 24 hours following the procedure.  For the next 24 hours, DO NOT: -Drive a car -Advertising copywriter -Drink alcoholic beverages -Take any medication unless instructed by your physician -Make any legal decisions or sign important papers.  Meals: Start with liquid foods such as gelatin or soup. Progress to regular foods as tolerated. Avoid greasy, spicy, heavy foods. If nausea and/or vomiting occur, drink only clear liquids until the nausea and/or vomiting subsides. Call your physician if vomiting continues.  Special Instructions/Symptoms: Your throat may feel dry or sore from the anesthesia or the breathing tube placed in your throat during surgery. If this causes discomfort, gargle with warm salt water. The discomfort should disappear within 24 hours.

## 2022-11-16 NOTE — Anesthesia Preprocedure Evaluation (Addendum)
Anesthesia Evaluation  Patient identified by MRN, date of birth, ID band Patient awake    Reviewed: Allergy & Precautions, NPO status , Patient's Chart, lab work & pertinent test results  Airway Mallampati: III       Dental no notable dental hx.    Pulmonary neg pulmonary ROS   Pulmonary exam normal        Cardiovascular hypertension, Pt. on medications and Pt. on home beta blockers Normal cardiovascular exam     Neuro/Psych negative neurological ROS  negative psych ROS   GI/Hepatic negative GI ROS, Neg liver ROS,,,  Endo/Other  negative endocrine ROS    Renal/GU negative Renal ROS     Musculoskeletal negative musculoskeletal ROS (+)    Abdominal  (+) + obese  Peds  Hematology negative hematology ROS (+)   Anesthesia Other Findings  abnormal pap smear of cervix can not exclude HGSIL  Reproductive/Obstetrics Hcg negative                             Anesthesia Physical Anesthesia Plan  ASA: 2  Anesthesia Plan: General   Post-op Pain Management:    Induction: Intravenous  PONV Risk Score and Plan: 3 and Ondansetron, Dexamethasone, Midazolam and Treatment may vary due to age or medical condition  Airway Management Planned: LMA  Additional Equipment:   Intra-op Plan:   Post-operative Plan: Extubation in OR  Informed Consent: I have reviewed the patients History and Physical, chart, labs and discussed the procedure including the risks, benefits and alternatives for the proposed anesthesia with the patient or authorized representative who has indicated his/her understanding and acceptance.     Dental advisory given  Plan Discussed with: CRNA  Anesthesia Plan Comments:        Anesthesia Quick Evaluation

## 2022-11-16 NOTE — Brief Op Note (Signed)
11/16/2022  2:18 PM  PATIENT:  Joan Allen  47 y.o. female  PRE-OPERATIVE DIAGNOSIS:  abnormal pap smear of cervix can not exclude HGSIL  POST-OPERATIVE DIAGNOSIS:  abnormal pap smear of cervix can not exclude HGSIL  PROCEDURE:  colposcopy directed cold knife conization, ecc  SURGEON:  Surgeon(s) and Role:    * Maxie Better, MD - Primary  PHYSICIAN ASSISTANT:   ASSISTANTS: none   ANESTHESIA:   general  EBL:  10 mL   BLOOD ADMINISTERED:none  DRAINS: none   LOCAL MEDICATIONS USED:  OTHER lidocaine with epi  SPECIMEN:  Source of Specimen:  conization, ecc  DISPOSITION OF SPECIMEN:  PATHOLOGY  COUNTS:  YES  TOURNIQUET:  * No tourniquets in log *  DICTATION: .Other Dictation: Dictation Number [id number]  PLAN OF CARE: Discharge to home after PACU  PATIENT DISPOSITION:  PACU - hemodynamically stable.   Delay start of Pharmacological VTE agent (>24hrs) due to surgical blood loss or risk of bleeding: no

## 2022-11-16 NOTE — Anesthesia Procedure Notes (Signed)
Procedure Name: LMA Insertion Date/Time: 11/16/2022 12:56 PM  Performed by: Earmon Phoenix, CRNAPre-anesthesia Checklist: Patient identified, Emergency Drugs available, Suction available, Patient being monitored and Timeout performed Patient Re-evaluated:Patient Re-evaluated prior to induction Oxygen Delivery Method: Circle system utilized Preoxygenation: Pre-oxygenation with 100% oxygen Induction Type: IV induction Ventilation: Mask ventilation without difficulty LMA: LMA inserted LMA Size: 4.0 Number of attempts: 1 Placement Confirmation: positive ETCO2 and breath sounds checked- equal and bilateral Tube secured with: Tape Dental Injury: Teeth and Oropharynx as per pre-operative assessment

## 2022-11-19 ENCOUNTER — Encounter (HOSPITAL_BASED_OUTPATIENT_CLINIC_OR_DEPARTMENT_OTHER): Payer: Self-pay | Admitting: Obstetrics and Gynecology

## 2022-11-19 LAB — POCT PREGNANCY, URINE: Preg Test, Ur: NEGATIVE

## 2022-11-19 NOTE — Anesthesia Postprocedure Evaluation (Signed)
Anesthesia Post Note  Patient: Joan Allen  Procedure(s) Performed: CONIZATION CERVIX WITH BIOPSY; COLPOSCOPY (Cervix)     Patient location during evaluation: PACU Anesthesia Type: General Level of consciousness: awake Pain management: pain level controlled Vital Signs Assessment: post-procedure vital signs reviewed and stable Respiratory status: spontaneous breathing, nonlabored ventilation and respiratory function stable Cardiovascular status: blood pressure returned to baseline and stable Postop Assessment: no apparent nausea or vomiting Anesthetic complications: no   No notable events documented.  Last Vitals:  Vitals:   11/16/22 1501 11/16/22 1533  BP: 130/88 131/88  Pulse: 71 72  Resp: 17 16  Temp: 36.6 C 36.6 C  SpO2: 97% 99%    Last Pain:  Vitals:   11/16/22 1201  TempSrc: Oral   Pain Goal:                   Catheryn Bacon Etoile Looman

## 2022-11-20 LAB — SURGICAL PATHOLOGY

## 2023-01-06 ENCOUNTER — Other Ambulatory Visit: Payer: Self-pay | Admitting: Cardiovascular Disease

## 2023-01-06 DIAGNOSIS — E785 Hyperlipidemia, unspecified: Secondary | ICD-10-CM

## 2023-01-08 MED ORDER — POTASSIUM CHLORIDE CRYS ER 10 MEQ PO TBCR: 10.0000 meq | EXTENDED_RELEASE_TABLET | Freq: Every day | ORAL | 1 refills | Status: DC

## 2023-01-28 ENCOUNTER — Other Ambulatory Visit: Payer: Self-pay | Admitting: Cardiovascular Disease

## 2023-01-28 DIAGNOSIS — E785 Hyperlipidemia, unspecified: Secondary | ICD-10-CM

## 2023-02-01 ENCOUNTER — Other Ambulatory Visit: Payer: Self-pay | Admitting: Cardiovascular Disease

## 2023-04-08 DIAGNOSIS — B301 Conjunctivitis due to adenovirus: Secondary | ICD-10-CM | POA: Diagnosis not present

## 2023-05-09 ENCOUNTER — Ambulatory Visit: Payer: BC Managed Care – PPO | Admitting: Podiatry

## 2023-05-09 ENCOUNTER — Ambulatory Visit: Payer: BC Managed Care – PPO

## 2023-05-09 ENCOUNTER — Encounter: Payer: Self-pay | Admitting: Podiatry

## 2023-05-09 DIAGNOSIS — M779 Enthesopathy, unspecified: Secondary | ICD-10-CM | POA: Diagnosis not present

## 2023-05-09 DIAGNOSIS — M7662 Achilles tendinitis, left leg: Secondary | ICD-10-CM

## 2023-05-09 MED ORDER — DICLOFENAC SODIUM 75 MG PO TBEC
75.0000 mg | DELAYED_RELEASE_TABLET | Freq: Two times a day (BID) | ORAL | 2 refills | Status: DC
Start: 2023-05-09 — End: 2023-08-23

## 2023-05-09 MED ORDER — TRIAMCINOLONE ACETONIDE 10 MG/ML IJ SUSP
10.0000 mg | Freq: Once | INTRAMUSCULAR | Status: AC
Start: 2023-05-09 — End: 2023-05-09
  Administered 2023-05-09: 10 mg via INTRA_ARTICULAR

## 2023-05-09 NOTE — Patient Instructions (Signed)

## 2023-05-10 ENCOUNTER — Other Ambulatory Visit: Payer: Self-pay | Admitting: Cardiovascular Disease

## 2023-05-10 NOTE — Progress Notes (Signed)
Subjective:   Patient ID: Joan Allen, female   DOB: 47 y.o.   MRN: [id number]   HPI Patient states she has a lot of pain in her left heel and states it seems to be more in the back then it seems to be in the bottom even though both hurt.  She does work a weightbearing job lots of hours has moderate obesity is complicating factor.  Patient does not smoke tries to be active   Review of Systems  All other systems reviewed and are negative.       Objective:  Physical Exam Vitals and nursing note reviewed.  Constitutional:      Appearance: She is well-developed.  Pulmonary:     Effort: Pulmonary effort is normal.  Musculoskeletal:        General: Normal range of motion.  Skin:    General: Skin is warm.  Neurological:     Mental Status: She is alert.     Neurovascular status intact muscle strength adequate range of motion adequate with exquisite discomfort posterior lateral aspect left heel moderate discomfort in the plantar fascia left with moderate depression of the arch no equinus condition noted.  Structural bunion deformity noted good digital perfusion well-oriented     Assessment:  Probability for Achilles tendinitis left lateral side with moderate plantar fasciitis also noted     Plan:  H&P both conditions reviewed small spur noted no stress fracture arthritis.  Went ahead today and discussed the inflammatory condition and we are going to do a careful injection and I did explain risk of rupture associated with this she is willing to accept risk and I went ahead today did sterile prep injected the lateral side of the Achilles insertion 3 mg dexamethasone Kenalog 5 mg Xylocaine and told her to take it easy on her foot and not do any kind of exercises that are dangerous to this area.  Applied heel lift to lift up the heel placed on oral diclofenac and reappoint to reevaluate again in 3 weeks may require treatment plantar

## 2023-05-13 NOTE — Telephone Encounter (Signed)
Per visit with Eligha Bridegroom, NP on 06/15/22: Hyperlipidemia LDL goal < 100: LDL 81 on 6/38/7564.  She is having right shoulder and wrist pain since July when atorvastatin was restarted.  Will have her try holding atorvastatin to see if symptoms improve.  If improvement off atorvastatin, would recommend that we try low-dose rosuvastatin. Heart healthy, mostly plant based diet and regular exercise encouraged.   Called and spoke with patient who states that she is back to taking Atorvastatin. She states that switching her BP medication to Bystolic, she has had no joint pain and resumed her Atorvastatin. Refill sent to pharmacy at this time.

## 2023-05-13 NOTE — Telephone Encounter (Signed)
Pt pharmacy requesting refill for medication not prescribed by Dr Elease Hashimoto. Please address. Thank you

## 2023-05-31 ENCOUNTER — Ambulatory Visit (INDEPENDENT_AMBULATORY_CARE_PROVIDER_SITE_OTHER): Payer: BC Managed Care – PPO | Admitting: Podiatry

## 2023-05-31 ENCOUNTER — Encounter: Payer: Self-pay | Admitting: Podiatry

## 2023-05-31 DIAGNOSIS — M722 Plantar fascial fibromatosis: Secondary | ICD-10-CM | POA: Diagnosis not present

## 2023-05-31 MED ORDER — TRIAMCINOLONE ACETONIDE 10 MG/ML IJ SUSP
10.0000 mg | Freq: Once | INTRAMUSCULAR | Status: AC
Start: 2023-05-31 — End: 2023-05-31
  Administered 2023-05-31: 10 mg via INTRA_ARTICULAR

## 2023-06-03 NOTE — Progress Notes (Signed)
Subjective:   Patient ID: Joan Allen, female   DOB: 47 y.o.   MRN: [id number]   HPI Patient states the Achilles seems to have improved but the bottom of the heel has been sore   ROS      Objective:  Physical Exam  No vascular status intact with posterior Achilles tendinitis improved reduced pain with inflammation pain of the plantar fascia left     Assessment:  Improved Achilles tendinitis left with acute plantar fasciitis left     Plan:  H&P reviewed for the back do not recommend treatment except for stretching and I did go ahead and I injected the plantar fascia left 3 mg Kenalog 5 mg Xylocaine applied sterile dressing

## 2023-08-22 ENCOUNTER — Encounter: Payer: Self-pay | Admitting: Cardiovascular Disease

## 2023-08-22 NOTE — Progress Notes (Signed)
 Cardiology Office Note   Date:  08/23/2023   ID:  Joan Allen, DOB Oct 23, 1975, MRN [id number]  PCP:  Patient, [name]  Cardiologist:   Kristeen Miss, MD   Chief Complaint  Patient presents with   Hyperlipidemia   Problem list 1. Hyperlipidemia.   October 06, 2015   Joan Allen is a 48 y.o. female who presents for further evaluation of her hyperlipidemia.  She's had elevated cholesterol level since her 20s.  She's never been put on any medications. She's tried to improve her diet. No CP with exertion.   Does have some DOE . No DOE doing normal household activities.   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  January 05, 2016  Joan Allen is seen today for follow up  Doing well. Walking some  Work is stressful  Trying to watch her diet   Aug. 24, 2018:  Joan Allen is seen today for follow up of hyperlipidemia  Has not been exercising much  No CP or dyspnea  April 17, 2018: Early seen today for follow-up visit.  She has a history of hyperlipidemia. Is getting some exercise  Walks the dog - no exercise except for that  Wt today is 242.  ( previous weight is 241 )  No Cp or dyspnea.     August 27, 2019:  Joan Allen is seen today for follow up of her hyperlipidemia and obesity Wt today is 244 lbs.  Had covid - was hospitalized in Dec. For 5 days .  Is not exercising .    Encouraged her to exercise   December 21, 2021: Joan Allen is seen today for follow-up of her hyperlipidemia and obesity. Her weight today is  259 lbs - up 15 lbs from 2 years ago   Ran out of her atorva a year ago  Eats take out food every day for lunch  Not getting any regular exercise    August 23, 2023 Joan Allen is seen for follow up of her HLD and obesity  Wt is  258  No regular exercise  Works as a Clinical biochemist for [employer]   She is having some diffuse muscle aches and shoulder pain.  She thinks it might be due to the atorvastatin.     We will discontinue the atorvastatin and start her on Zetia 10 mg a day.  She will let us know in about a month whether or not the muscle aches have resolved.      Past Medical History:  Diagnosis Date   Abnormal Pap smear of cervix    History of migraine headaches    Hyperlipidemia, mixed    followed by dr Melburn Popper and pcp   Hypertension    Pre-diabetes    Wears contact lenses    per pt right eye only    Past Surgical History:  Procedure Laterality Date   CATARACT EXTRACTION W/ INTRAOCULAR LENS IMPLANT Bilateral 2018   CERVICAL CONIZATION W/BX N/A 11/16/2022   Procedure: CONIZATION CERVIX WITH BIOPSY; COLPOSCOPY;  Surgeon: Maxie Better, MD;  Location: [facility];  Service: Gynecology;  Laterality: N/A;   COLONOSCOPY WITH PROPOFOL  2014     Current Outpatient Medications  Medication Sig Dispense Refill   levonorgestrel (MIRENA) 20 MCG/24HR IUD 1 each by Intrauterine route once.     nebivolol (BYSTOLIC) 5 MG tablet Take 5 mg by mouth daily.     No current  facility-administered medications for this visit.    Allergies:   Fentanyl    Social History:  The patient  reports that she has never smoked. She has never used smokeless tobacco. She reports that she does not currently use alcohol. She reports that she does not use drugs.   Family History:  The patient's family history includes Breast cancer in her maternal grandmother; Cancer in her father and maternal grandmother; Diabetes in her maternal grandfather; Hypertension in her mother; Kidney Stones in her mother; Stroke in her maternal grandfather.    ROS:  Please see the history of present illness.     Physical Exam: Blood pressure (!) 132/98, pulse 98, height 5' 10.75" (1.797 m), weight 258 lb 3.2 oz (117.1 kg), SpO2 96%.  HYPERTENSION CONTROL Vitals:   08/23/23 1559 08/23/23 1643  BP: (!) 138/98 (!) 132/98    The patient's blood pressure is elevated above target today.  In order to  address the patient's elevated BP: A new medication was prescribed today.       GEN:  Well nourished, well developed in no acute distress HEENT: Normal NECK: No JVD; No carotid bruits LYMPHATICS: No lymphadenopathy CARDIAC: RRR , no murmurs, rubs, gallops RESPIRATORY:  Clear to auscultation without rales, wheezing or rhonchi  ABDOMEN: Soft, non-tender, non-distended MUSCULOSKELETAL:  No edema; No deformity  SKIN: Warm and dry NEUROLOGIC:  Alert and oriented x 3   EKG:    EKG Interpretation Date/Time:  Friday August 23 2023 16:01:36 EST Ventricular Rate:  71 PR Interval:  148 QRS Duration:  90 QT Interval:  414 QTC Calculation: 449 R Axis:   26  Text Interpretation: Normal sinus rhythm Normal ECG When compared with ECG of 31-May-2019 18:22, PREVIOUS ECG IS PRESENT Confirmed by Kristeen Miss ([id number]) on 08/23/2023 4:42:39 PM      Recent Labs: 11/16/2022: BUN 15; Creatinine, Ser 0.70; Hemoglobin 12.9; Platelets 190; Potassium 4.6; Sodium 141    Lipid Panel    Component Value Date/Time   CHOL 132 03/01/2022 0750   TRIG 99 03/01/2022 0750   HDL 32 (L) 03/01/2022 0750   CHOLHDL 4.1 03/01/2022 0750   CHOLHDL 3.7 01/05/2016 0843   VLDL 29 01/05/2016 0843   LDLCALC 81 03/01/2022 0750      Wt Readings from Last 3 Encounters:  08/23/23 258 lb 3.2 oz (117.1 kg)  11/16/22 261 lb (118.4 kg)  06/15/22 250 lb 12.8 oz (113.8 kg)      Other studies Reviewed: Additional studies/ records that were reviewed today include: . Review of the above records demonstrates:    ASSESSMENT AND PLAN:  1.  Hyperlipidemia :       She is having some diffuse muscle aches and shoulder pain.  She thinks it might be due to the atorvastatin.  We will discontinue the atorvastatin and start her on Zetia 10 mg a day.  She will let us know in about a month whether or not the muscle aches have resolved.    2. Hypertension:     Will add HCTZ 12 and half milligrams a day.  I have advised her to  work on diet, exercise, weight loss.  She needs to reduce the salt in her diet.  She will follow-up with [place] in 6 months.  3. Obesity :      I have strongly advised her to work on weight loss.  Current medicines are reviewed at length with the patient today.  The patient does not have concerns regarding medicines.  The following changes have been made:  no change  Labs/ tests ordered today include:   Orders Placed This Encounter  Procedures   EKG 12-Lead    Kristeen Miss, MD  08/23/2023 4:43 PM    [facility] [address] [place], [place], [place]  [address] Phone: [phone]; Fax: [phone]

## 2023-08-23 ENCOUNTER — Ambulatory Visit: Payer: BC Managed Care – PPO | Attending: Cardiovascular Disease | Admitting: Cardiovascular Disease

## 2023-08-23 ENCOUNTER — Encounter: Payer: Self-pay | Admitting: Cardiovascular Disease

## 2023-08-23 VITALS — BP 132/98 | HR 98 | Ht 70.75 in | Wt 258.2 lb

## 2023-08-23 DIAGNOSIS — I10 Essential (primary) hypertension: Secondary | ICD-10-CM

## 2023-08-23 DIAGNOSIS — E785 Hyperlipidemia, unspecified: Secondary | ICD-10-CM

## 2023-08-23 MED ORDER — HYDROCHLOROTHIAZIDE 12.5 MG PO CAPS: 12.5000 mg | ORAL_CAPSULE | Freq: Every day | ORAL | 3 refills | Status: DC

## 2023-08-23 MED ORDER — EZETIMIBE 10 MG PO TABS
10.0000 mg | ORAL_TABLET | Freq: Every day | ORAL | 3 refills | Status: DC
Start: 2023-08-23 — End: 2024-04-21

## 2023-08-23 MED ORDER — POTASSIUM CHLORIDE ER 10 MEQ PO TBCR
10.0000 meq | EXTENDED_RELEASE_TABLET | Freq: Every day | ORAL | 3 refills | Status: AC
Start: 2023-08-23 — End: ?

## 2023-08-23 NOTE — Patient Instructions (Signed)
 Medication Instructions:  STOP Atorvastatin START Zetia/Ezetimibe 10mg  daily START Potassium Chloride daily START Hydrochlorothiazide 12.5mg  daily *If you need a refill on your cardiac medications before your next appointment, please call your pharmacy*  Lab Work: BMET in 3 weeks Lipids, ALT, BMET in 6 months (prior to next appt) If you have labs (blood work) drawn today and your tests are completely normal, you will receive your results only by: MyChart Message (if you have MyChart) OR A paper copy in the mail If you have any lab test that is abnormal or we need to change your treatment, we will call you to review the results.  Follow-Up: At [facility], you and your health needs are our priority.  As part of our continuing mission to provide you with exceptional heart care, we have created designated Provider Care Teams.  These Care Teams include your primary Cardiologist (physician) and Advanced Practice Providers (APPs -  Physician Assistants and Nurse Practitioners) who all work together to provide you with the care you need, when you need it.  Your next appointment:   6 month(s)  Provider:   Kristeen Miss, MD      1st Floor: - Lobby - Registration  - Pharmacy  - Lab - Cafe  2nd Floor: - PV Lab - Diagnostic Testing (echo, CT, nuclear med)  3rd Floor: - Vacant  4th Floor: - TCTS (cardiothoracic surgery) - AFib Clinic - Structural Heart Clinic - Vascular Surgery  - Vascular Ultrasound  5th Floor: - HeartCare Cardiology (general and EP) - Clinical Pharmacy for coumadin, hypertension, lipid, weight-loss medications, and med management appointments    Valet parking services will be available as well.

## 2023-08-29 IMAGING — MG MM DIGITAL SCREENING BILAT W/ TOMO AND CAD
8 series · 8 of 24 positions shown · non-contrast
Comparison: Previous exam(s).

CLINICAL DATA: Screening.

EXAM:
DIGITAL SCREENING BILATERAL MAMMOGRAM WITH TOMOSYNTHESIS AND CAD
TECHNIQUE: Bilateral screening digital craniocaudal and mediolateral oblique
mammograms were obtained. Bilateral screening digital breast
tomosynthesis was performed. The images were evaluated with
computer-aided detection.

[R CC synth-2D]
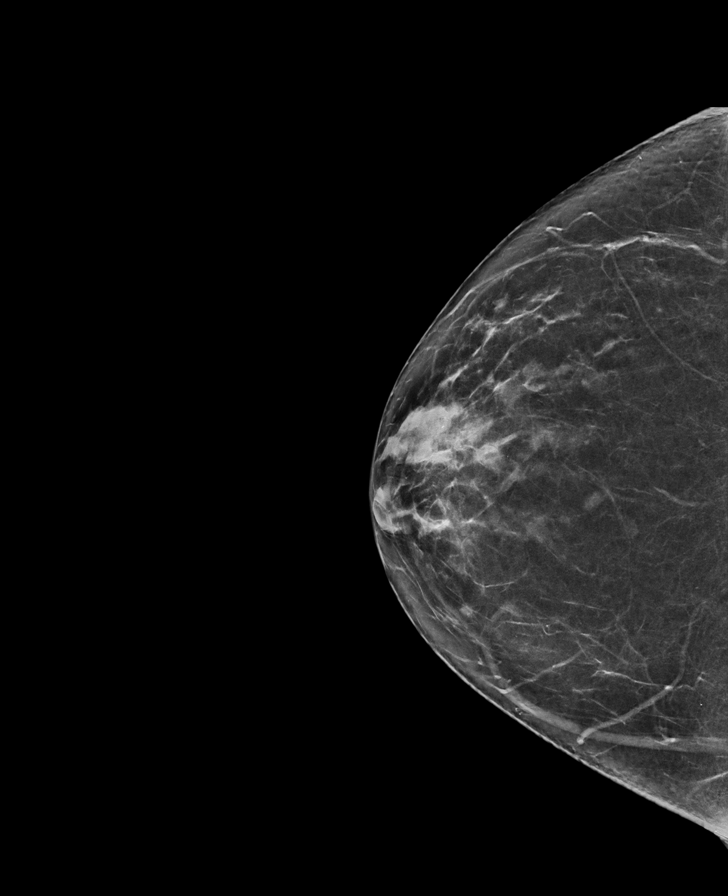

[R MLO synth-2D]
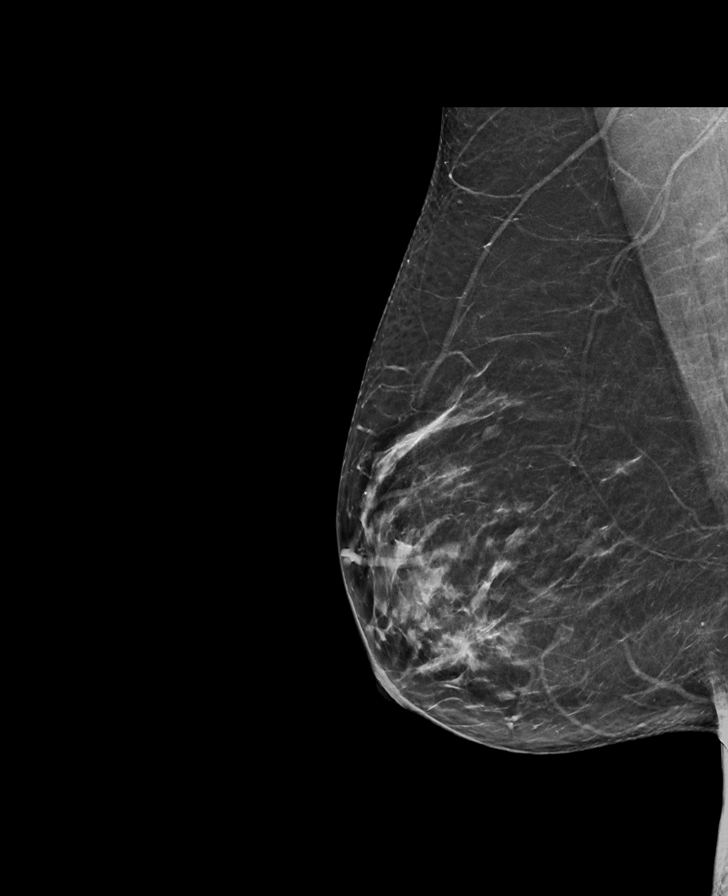

[L CC synth-2D]
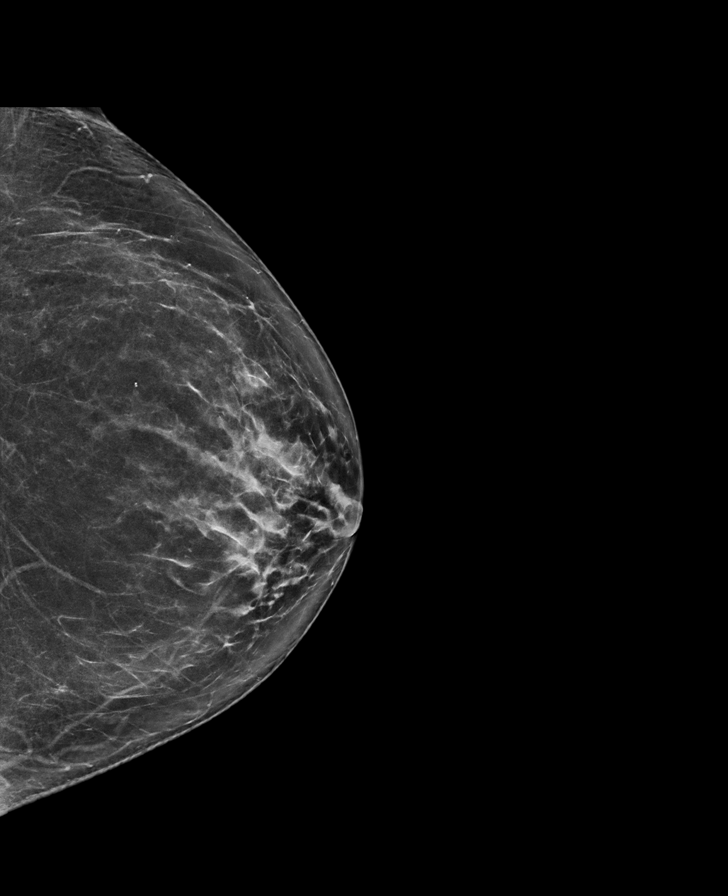

[L MLO synth-2D]
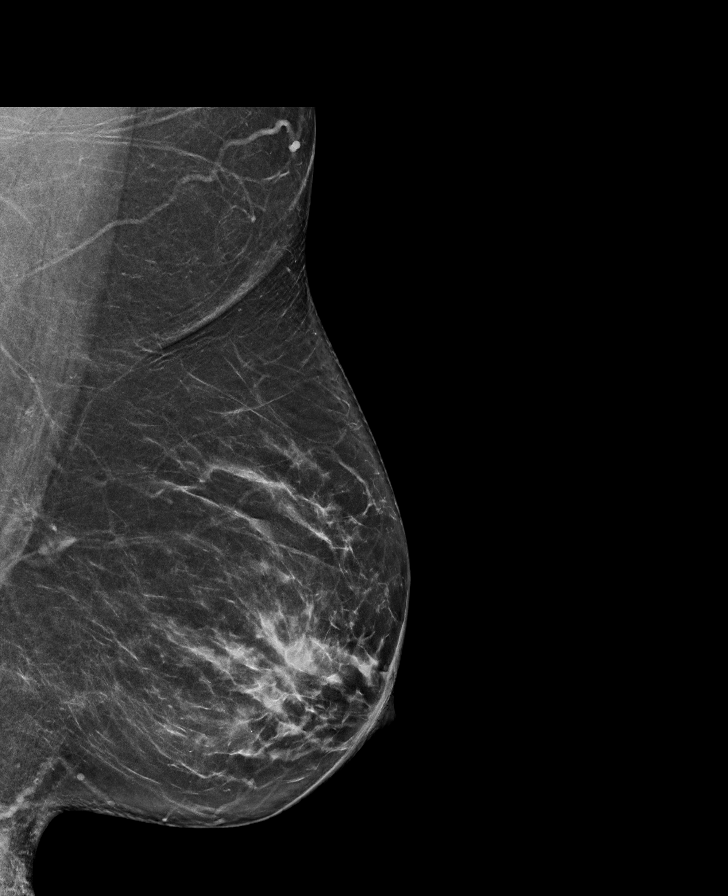

[L MLO tomo · tomo slice 41/82.0]
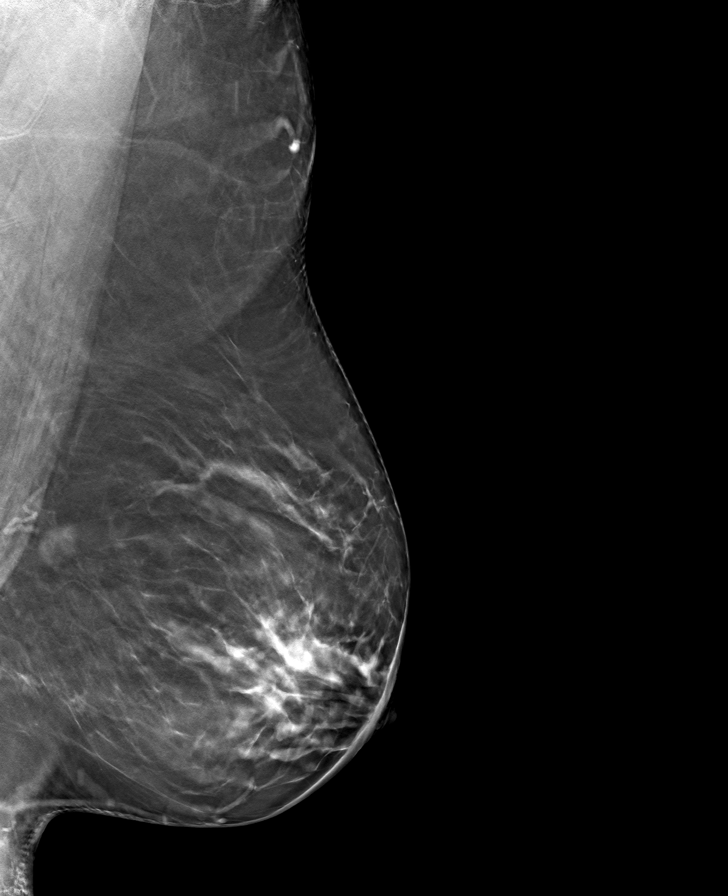

[R CC tomo · tomo slice 36/71.0]
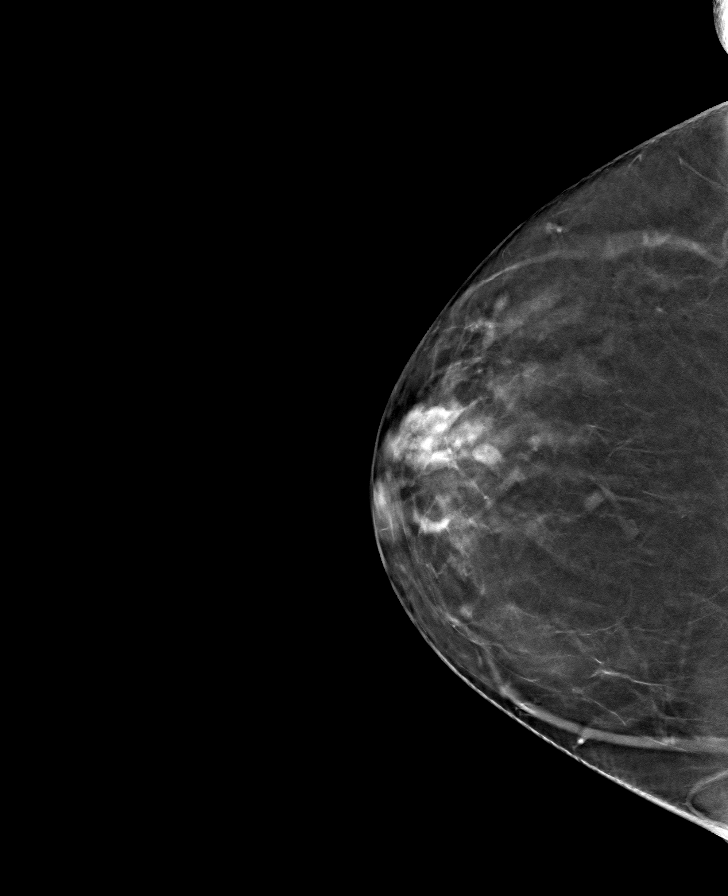

[L CC tomo · tomo slice 38/75.0]
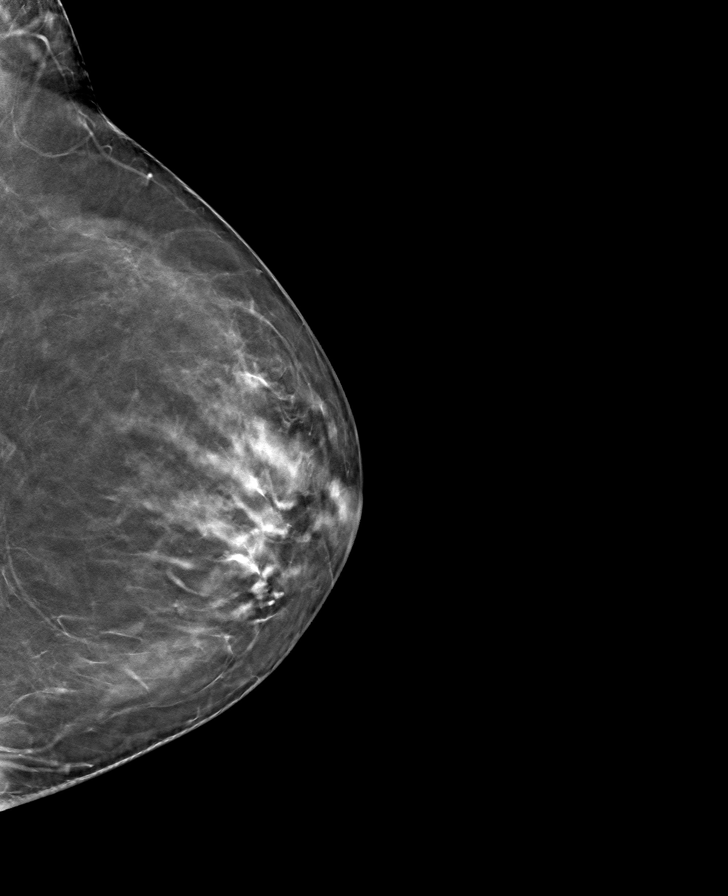

[R MLO tomo · tomo slice 37/74.0]
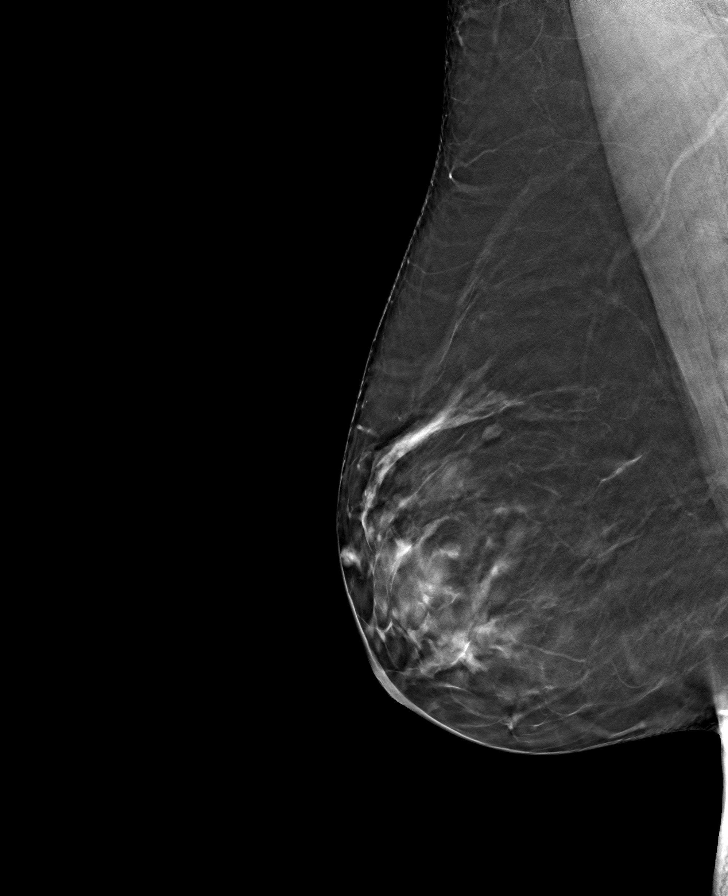

[8 of 24 positions shown; findings below may reference images not displayed]

ACR Breast Density Category c: The breast tissue is heterogeneously
dense, which may obscure small masses.
FINDINGS: There are no findings suspicious for malignancy.
IMPRESSION: No mammographic evidence of malignancy. A result letter of this
screening mammogram will be mailed directly to the patient.

RECOMMENDATION:
Screening mammogram in one year. (Code:Q3-W-BC3)

BI-RADS CATEGORY  1: Negative.

## 2023-09-13 DIAGNOSIS — E785 Hyperlipidemia, unspecified: Secondary | ICD-10-CM | POA: Diagnosis not present

## 2023-09-13 DIAGNOSIS — I10 Essential (primary) hypertension: Secondary | ICD-10-CM | POA: Diagnosis not present

## 2023-09-13 LAB — BASIC METABOLIC PANEL WITH GFR
BUN/Creatinine Ratio: 17 (ref 9–23)
BUN: 13 mg/dL (ref 6–24)
CO2: 24 mmol/L (ref 20–29)
Calcium: 10.2 mg/dL (ref 8.7–10.2)
Chloride: 103 mmol/L (ref 96–106)
Creatinine, Ser: 0.77 mg/dL (ref 0.57–1.00)
Glucose: 107 mg/dL — ABNORMAL HIGH (ref 70–99)
Potassium: 4.1 mmol/L (ref 3.5–5.2)
Sodium: 142 mmol/L (ref 134–144)
eGFR: 95 mL/min/{1.73_m2} (ref >59)

## 2023-09-15 ENCOUNTER — Encounter: Payer: Self-pay | Admitting: Cardiovascular Disease

## 2023-09-30 DIAGNOSIS — Z01419 Encounter for gynecological examination (general) (routine) without abnormal findings: Secondary | ICD-10-CM | POA: Diagnosis not present

## 2023-10-14 ENCOUNTER — Other Ambulatory Visit: Payer: Self-pay | Admitting: Obstetrics and Gynecology

## 2023-10-14 DIAGNOSIS — Z1231 Encounter for screening mammogram for malignant neoplasm of breast: Secondary | ICD-10-CM

## 2023-10-17 ENCOUNTER — Ambulatory Visit
Admission: RE | Admit: 2023-10-17 | Discharge: 2023-10-17 | Disposition: A | Discharge status: Home / Self Care | Source: Ambulatory Visit

## 2023-10-17 DIAGNOSIS — Z1231 Encounter for screening mammogram for malignant neoplasm of breast: Secondary | ICD-10-CM

## 2023-11-21 ENCOUNTER — Other Ambulatory Visit: Payer: Self-pay | Admitting: Nurse Practitioner

## 2023-12-13 DIAGNOSIS — Z01818 Encounter for other preprocedural examination: Secondary | ICD-10-CM | POA: Diagnosis not present

## 2024-01-31 DIAGNOSIS — Z1211 Encounter for screening for malignant neoplasm of colon: Secondary | ICD-10-CM | POA: Diagnosis not present

## 2024-02-15 LAB — LIPID PANEL
Chol/HDL Ratio: 5.9 ratio — ABNORMAL HIGH (ref 0.0–4.4)
Cholesterol, Total: 171 mg/dL (ref 100–199)
HDL: 29 mg/dL — ABNORMAL LOW (ref >39)
LDL Chol Calc (NIH): 112 mg/dL — ABNORMAL HIGH (ref 0–99)
Triglycerides: 171 mg/dL — ABNORMAL HIGH (ref 0–149)
VLDL Cholesterol Cal: 30 mg/dL (ref 5–40)

## 2024-02-15 LAB — ALT: ALT: 35 IU/L — ABNORMAL HIGH (ref 0–32)

## 2024-02-15 LAB — BASIC METABOLIC PANEL WITH GFR
BUN/Creatinine Ratio: 14 (ref 9–23)
BUN: 10 mg/dL (ref 6–24)
CO2: 23 mmol/L (ref 20–29)
Calcium: 9.5 mg/dL (ref 8.7–10.2)
Chloride: 106 mmol/L (ref 96–106)
Creatinine, Ser: 0.7 mg/dL (ref 0.57–1.00)
Glucose: 123 mg/dL — ABNORMAL HIGH (ref 70–99)
Potassium: 4.3 mmol/L (ref 3.5–5.2)
Sodium: 144 mmol/L (ref 134–144)
eGFR: 106 mL/min/1.73 (ref >59)

## 2024-02-20 NOTE — Progress Notes (Signed)
 Cardiology Office Note    Patient Name: Joan Allen Date of Encounter: 02/20/2024  Primary Care Provider:  Patient, [name] Primary Cardiologist:  Aleene Passe, MD (Inactive) Primary Electrophysiologist: None   Past Medical History    Past Medical History:  Diagnosis Date   Abnormal Pap smear of cervix    History of migraine headaches    Hyperlipidemia, mixed    followed by dr calhoun and pcp   Hypertension    Pre-diabetes    Wears contact lenses    per pt right eye only    History of Present Illness  Joan Allen is a 48 y.o. female with a PMH of HLD, HTN, prediabetes, obesity who presents today for 60-month follow-up.  Joan Allen was seen initially by Dr. Passe on 09/2015 for evaluation of hyperlipidemia.  She reported elevated cholesterol levels since her 38s but was never put on medications.  She was advised to work on lifestyle modifications with diet and exercise at that time.  She had cholesterol numbers checked and was started on atorvastatin  40 mg daily.  She was seen on 12/2021 and her blood pressure was elevated and patient was started on losartan  50 mg as well as HCTZ 25 mg daily.  She was seen in follow-up on 05/2022 and blood pressure was well-controlled and patient experienced joint pain and was given a holiday for statin.  She was seen on 08/23/2023 and had atorvastatin  discontinued and was started on Zetia  10 mg.  She was also noted to have elevated BP at 138/98 HCTZ 12.5 mg was added to therapy.  Continue Bystolic  5 mg daily and ezetimibe  10 mg.  She had cholesterol numbers checked recently that showed triglycerides of 171 which is an increase from a year prior as well as elevation in LDL cholesterol elevated to 112 from 81.  Joan Allen presents today for 3-month follow-up. She has a history of hypertension, initially diagnosed in 2023, and was started on losartan  and hydrochlorothiazide . Her blood pressure was well controlled at her last visit in December 2023.  She is currently taking hydrochlorothiazide  and nebivolol . She does not monitor her blood pressure at home but has a cuff available. No new chest pain, palpitations, or changes in her heart health since her last visit. She experiences stress but denies any chest tightness or heaviness. She also has a history of hyperlipidemia, initially managed with lifestyle modifications and atorvastatin  since 2017. Due to joint pain, atorvastatin  was discontinued, and she was switched to ezetimibe . Her recent cholesterol levels show elevated LDL and triglycerides, with decreased HDL levels. She has not tried rosuvastatin  before. She reports joint discomfort in her shoulders and hips while on atorvastatin . Her family history is significant for high blood pressure and cholesterol in her parents. 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 She uses compression socks and has received injections for her foot conditions without significant relief. She has not engaged in activities like recumbent biking or swimming due to back problems. She follows a diet primarily consisting of chicken and fish, avoiding fried foods. Her husband has alpha-gal syndrome, influencing her dietary choices. She does not consume much dairy, except for mozzarella cheese, and avoids beef and pork. She does not add salt to her food but is not actively monitoring her salt intake. Patient denies chest pain, palpitations, dyspnea, PND, orthopnea, nausea, vomiting, dizziness, syncope, edema, weight gain, or early satiety.  Discussed  the use of AI scribe software for clinical note transcription with the patient, who gave verbal consent to proceed.  History of Present Illness   Review of Systems  Please see the history of present illness.    All other systems reviewed and are otherwise negative except as noted above.  Physical Exam    Wt Readings from Last 3  Encounters:  08/23/23 258 lb 3.2 oz (117.1 kg)  11/16/22 261 lb (118.4 kg)  06/15/22 250 lb 12.8 oz (113.8 kg)   CD:Uyzmz were no vitals filed for this visit.,There is no height or weight on file to calculate BMI. GEN: Well nourished, well developed in no acute distress Neck: No JVD; No carotid bruits Pulmonary: Clear to auscultation without rales, wheezing or rhonchi  Cardiovascular: Normal rate. Regular rhythm. Normal S1. Normal S2.   Murmurs: There is no murmur.  ABDOMEN: Soft, non-tender, non-distended EXTREMITIES: Trace bilateral  EKG/LABS/ Recent Cardiac Studies   ECG personally reviewed by me today -none completed today  Risk Assessment/Calculations:       Lab Results  Component Value Date   WBC 5.4 11/16/2022   HGB 12.9 11/16/2022   HCT 38.0 11/16/2022   MCV 94.6 11/16/2022   PLT 190 11/16/2022   Lab Results  Component Value Date   CREATININE 0.70 02/14/2024   BUN 10 02/14/2024   NA 144 02/14/2024   K 4.3 02/14/2024   CL 106 02/14/2024   CO2 23 02/14/2024   Lab Results  Component Value Date   CHOL 171 02/14/2024   HDL 29 (L) 02/14/2024   LDLCALC 112 (H) 02/14/2024   TRIG 171 (H) 02/14/2024   CHOLHDL 5.9 (H) 02/14/2024    Lab Results  Component Value Date   HGBA1C 5.6 07/21/2019   Assessment & Plan    Assessment & Plan  1.  Hyperlipidemia: -Cholesterol elevated on ezetimibe  monotherapy. Previous atorvastatin  caused joint pain, indicating statin intolerance. LDL above goal. Discussed trial of low-dose rosuvastatin to assess tolerance. Insurance requires failure of two statins for non-statin coverage. - Initiate rosuvastatin 5 mg with ezetimibe . - Monitor for joint pain or side effects. - If intolerant, refer to clinical pharmacist for non-statin options.  2.  Essential hypertension: -Blood pressure elevated at 142/90 mmHg. Current regimen includes losartan , hydrochlorothiazide , and nebivolol . Discussed switching to chlorthalidone  for better control.  Considered white coat hypertension as a factor. - Discontinue hydrochlorothiazide . - Initiate chlorthalidone  25 mg. - Check kidney function and electrolytes in 1-2 weeks. - Provide blood pressure log for home monitoring. - Advise on DASH diet.  3.  Obesity: - Patient's BMI is 38.02 kg/m - He has tolerated at least 150 minutes/week.  Disposition: Follow-up with new cardiologist or APP in 6 months    Signed, Wyn Raddle, Jackee Shove, NP 02/20/2024, 7:38 AM [facility]

## 2024-02-21 ENCOUNTER — Encounter: Payer: Self-pay | Admitting: Nurse Practitioner

## 2024-02-21 ENCOUNTER — Ambulatory Visit: Attending: Nurse Practitioner | Admitting: Nurse Practitioner

## 2024-02-21 ENCOUNTER — Other Ambulatory Visit: Payer: Self-pay

## 2024-02-21 VITALS — BP 146/102 | HR 77 | Ht 70.0 in | Wt 265.0 lb

## 2024-02-21 DIAGNOSIS — I10 Essential (primary) hypertension: Secondary | ICD-10-CM

## 2024-02-21 DIAGNOSIS — E66812 Obesity, class 2: Secondary | ICD-10-CM | POA: Diagnosis not present

## 2024-02-21 DIAGNOSIS — E785 Hyperlipidemia, unspecified: Secondary | ICD-10-CM | POA: Diagnosis not present

## 2024-02-21 DIAGNOSIS — Z6835 Body mass index (BMI) 35.0-35.9, adult: Secondary | ICD-10-CM

## 2024-02-21 MED ORDER — CHLORTHALIDONE 25 MG PO TABS: 25.0000 mg | ORAL_TABLET | Freq: Every day | ORAL | 1 refills | Status: AC

## 2024-02-21 NOTE — Patient Instructions (Addendum)
 Medication Instructions:  START Crestor 5mg  Take 1 tablet once a day  START Hygroton  (Chlorthalidone ) 25mg  Take 1 tablet once a day  STOP hydrochlorothiazide  (hydrochlorothiazide ) *If you need a refill on your cardiac medications before your next appointment, please call your pharmacy*  Lab Work: BMET IN 2 WEEKS (03/06/2024) 8 WEEKS FASTING LIPIDS & LFTS (04/17/2024) If you have labs (blood work) drawn today and your tests are completely normal, you will receive your results only by: MyChart Message (if you have MyChart) OR A paper copy in the mail If you have any lab test that is abnormal or we need to change your treatment, we will call you to review the results.  Testing/Procedures: NONE ORDERED  Follow-Up: At [facility], you and your health needs are our priority.  As part of our continuing mission to provide you with exceptional heart care, our providers are all part of one team.  This team includes your primary Cardiologist (physician) and Advanced Practice Providers or APPs (Physician Assistants and Nurse Practitioners) who all work together to provide you with the care you need, when you need it.  Your next appointment:   6 month(s)  Provider:   Emeline Calender, MD  We recommend signing up for the patient portal called MyChart.  Sign up information is provided on this After Visit Summary.  MyChart is used to connect with patients for Virtual Visits (Telemedicine).  Patients are able to view lab/test results, encounter notes, upcoming appointments, etc.  Non-urgent messages can be sent to your provider as well.   To learn more about what you can do with MyChart, go to [email or web address].   Other Instructions Check your blood pressure daily for 2 weeks, then contact the office with your readings.  Contact the office either by phone or MyChart with your readings.  Make sure to check your blood pressure 2 hours after taking your medications.   AVOID these things for  30 minutes before checking your blood pressure: No Drinking caffeine. No Drinking alcohol. No Eating. No Smoking. No Exercising.  Five minutes before checking your blood pressure: Pee. Sit in a dining chair. Avoid sitting in a soft couch or armchair. Be quiet. Do not talk.

## 2024-02-24 MED ORDER — ROSUVASTATIN CALCIUM 5 MG PO TABS: 5.0000 mg | ORAL_TABLET | Freq: Every day | ORAL | 3 refills | Status: DC

## 2024-02-25 ENCOUNTER — Other Ambulatory Visit (HOSPITAL_COMMUNITY): Payer: Self-pay

## 2024-02-25 ENCOUNTER — Telehealth: Payer: Self-pay | Admitting: Pharmacy Technician

## 2024-02-25 NOTE — Telephone Encounter (Signed)
   Pharmacy Patient Advocate Encounter   Received notification from Onbase that prior authorization for ROSUVASTATIN  5MG  is required/requested.   Insurance verification completed.   The patient is insured through [facility] .   Per test claim: PA required; PA submitted to above mentioned insurance via Latent Key/confirmation #/EOC [id number] Status is pending

## 2024-02-25 NOTE — Telephone Encounter (Signed)
 Pharmacy Patient Advocate Encounter  Received notification from [facility] that Prior Authorization for rosuvastatin  5mg  has been APPROVED from 02/25/24 to 02/24/27   PA #/Case ID/Reference #: [id number]

## 2024-03-13 DIAGNOSIS — E66812 Obesity, class 2: Secondary | ICD-10-CM | POA: Diagnosis not present

## 2024-03-13 DIAGNOSIS — I10 Essential (primary) hypertension: Secondary | ICD-10-CM | POA: Diagnosis not present

## 2024-03-14 LAB — BASIC METABOLIC PANEL WITH GFR
BUN/Creatinine Ratio: 18 (ref 9–23)
BUN: 13 mg/dL (ref 6–24)
CO2: 25 mmol/L (ref 20–29)
Calcium: 9.7 mg/dL (ref 8.7–10.2)
Chloride: 100 mmol/L (ref 96–106)
Creatinine, Ser: 0.74 mg/dL (ref 0.57–1.00)
Glucose: 139 mg/dL — ABNORMAL HIGH (ref 70–99)
Potassium: 3.8 mmol/L (ref 3.5–5.2)
Sodium: 140 mmol/L (ref 134–144)
eGFR: 99 mL/min/1.73 (ref >59)

## 2024-03-15 ENCOUNTER — Ambulatory Visit: Payer: Self-pay | Admitting: Nurse Practitioner

## 2024-03-15 DIAGNOSIS — E785 Hyperlipidemia, unspecified: Secondary | ICD-10-CM

## 2024-03-20 DIAGNOSIS — N911 Secondary amenorrhea: Secondary | ICD-10-CM | POA: Diagnosis not present

## 2024-04-17 DIAGNOSIS — I10 Essential (primary) hypertension: Secondary | ICD-10-CM | POA: Diagnosis not present

## 2024-04-17 DIAGNOSIS — E785 Hyperlipidemia, unspecified: Secondary | ICD-10-CM | POA: Diagnosis not present

## 2024-04-17 DIAGNOSIS — E66812 Obesity, class 2: Secondary | ICD-10-CM | POA: Diagnosis not present

## 2024-04-18 LAB — LIPID PANEL
Chol/HDL Ratio: 4.4 ratio (ref 0.0–4.4)
Cholesterol, Total: 127 mg/dL (ref 100–199)
HDL: 29 mg/dL — ABNORMAL LOW (ref >39)
LDL Chol Calc (NIH): 68 mg/dL (ref 0–99)
Triglycerides: 174 mg/dL — ABNORMAL HIGH (ref 0–149)
VLDL Cholesterol Cal: 30 mg/dL (ref 5–40)

## 2024-04-18 LAB — HEPATIC FUNCTION PANEL
ALT: 41 IU/L — ABNORMAL HIGH (ref 0–32)
AST: 44 IU/L — ABNORMAL HIGH (ref 0–40)
Albumin: 4.5 g/dL (ref 3.9–4.9)
Alkaline Phosphatase: 80 IU/L (ref 41–116)
Bilirubin Total: 0.7 mg/dL (ref 0.0–1.2)
Bilirubin, Direct: 0.21 mg/dL (ref 0.00–0.40)
Total Protein: 6.9 g/dL (ref 6.0–8.5)

## 2024-04-20 NOTE — Telephone Encounter (Signed)
 LVM asking pt to call our office to discuss lab results. 1st attempt

## 2024-04-21 NOTE — Telephone Encounter (Signed)
 Spoke with pt regarding lab results. Pt verbalized understanding. Crestor  and Zetia  d/c. Referral sen to Lipid clinic. Pt had no further questions at this time.

## 2024-04-22 DIAGNOSIS — Z131 Encounter for screening for diabetes mellitus: Secondary | ICD-10-CM | POA: Diagnosis not present

## 2024-04-22 DIAGNOSIS — R0683 Snoring: Secondary | ICD-10-CM | POA: Diagnosis not present

## 2024-04-22 DIAGNOSIS — R202 Paresthesia of skin: Secondary | ICD-10-CM | POA: Diagnosis not present

## 2024-04-22 DIAGNOSIS — Z0000 Encounter for general adult medical examination without abnormal findings: Secondary | ICD-10-CM | POA: Diagnosis not present

## 2024-05-27 DIAGNOSIS — I10 Essential (primary) hypertension: Secondary | ICD-10-CM | POA: Diagnosis not present

## 2024-05-27 DIAGNOSIS — G4730 Sleep apnea, unspecified: Secondary | ICD-10-CM | POA: Diagnosis not present

## 2024-05-27 NOTE — Addendum Note (Signed)
 Addended by: SEBASTIAN JANESE GRADE on: 05/27/2024 03:51 PM   Modules accepted: Orders

## 2024-06-12 ENCOUNTER — Ambulatory Visit: Payer: Self-pay | Attending: Cardiology | Admitting: Pharmacist Clinician (PhC)/ Clinical Pharmacy Specialist

## 2024-06-12 ENCOUNTER — Encounter: Payer: Self-pay | Admitting: Pharmacist Clinician (PhC)/ Clinical Pharmacy Specialist

## 2024-06-12 DIAGNOSIS — E785 Hyperlipidemia, unspecified: Secondary | ICD-10-CM | POA: Diagnosis not present

## 2024-06-12 NOTE — Patient Instructions (Addendum)
 Your Results:             Your most recent labs Goal  Total Cholesterol 127 < 200  Triglycerides 174 < 150  HDL (happy/good cholesterol) 29 > 40  LDL (lousy/bad cholesterol 68 < 68   Medication changes:  Re-start rosuvastatin  5 mg daily and ezetimibe  10 mg daily.  Lab orders:  Check liver function labs this morning, then repeat liver function and cholesterol in about 6 weeks.   Thank you for choosing [facility]

## 2024-06-12 NOTE — Progress Notes (Signed)
 "  Office Visit    Patient Name: Joan Allen Date of Encounter: 06/12/2024  Primary Care Provider:  Patient, [name] Primary Cardiologist:  None  Chief Complaint    Hyperlipidemia   Significant Past Medical History   HTN Dx in 2023, now on nebivolol , hctz  DM2 11/25 A1c 6.7, now on Mounjaro, down 11 pounds in 3 weeks     Allergies[1]  History of Present Illness    Joan Allen is a 48 y.o. female patient of Dr Alveta (retired), in the office today to discuss options for cholesterol management.  Patient reported that her cholesterol levels have been elevated since she was in her 29's. She had developed joint pains with atorvastatin , so when seen by Jackee Alberts in September, he ordered rosuvastatin  5 mg.  Follow up labs later showed a slight increase in LFT's and he had her discontinue both the rosuvastatin  and ezetimibe .   Today she is in the office to discuss options.    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)   Accessory Clinical Findings   Lab Results  Component Value Date   CHOL 127 04/17/2024   HDL 29 (L) 04/17/2024   LDLCALC 68 04/17/2024   TRIG 174 (H) 04/17/2024   CHOLHDL 4.4 04/17/2024    No results found for: LIPOA  Lab Results  Component Value Date   ALT 41 (H) 04/17/2024   AST 44 (H) 04/17/2024   ALKPHOS 80 04/17/2024   BILITOT 0.7 04/17/2024   Lab Results  Component Value Date   CREATININE 0.74 03/13/2024    BUN 13 03/13/2024   NA 140 03/13/2024   K 3.8 03/13/2024   CL 100 03/13/2024   CO2 25 03/13/2024   Lab Results  Component Value Date   HGBA1C 5.6 07/21/2019    Home Medications    Current Outpatient Medications  Medication Sig Dispense Refill   chlorthalidone  (HYGROTON ) 25 MG tablet Take 1 tablet (25 mg total) by mouth daily. 90 tablet 1   levonorgestrel (MIRENA) 20 MCG/24HR IUD 1 each by Intrauterine route once.     nebivolol  (BYSTOLIC ) 5 MG tablet TAKE 1 TABLET BY MOUTH DAILY 90 tablet 2   potassium chloride  (KLOR-CON ) 10 MEQ tablet Take 1 tablet (10 mEq total) by mouth daily. 90 tablet 3   No current facility-administered medications for this visit.     Assessment & Plan    Hyperlipidemia Assessment: Patient with DM2 at LDL goal of < 70 Most recent LDL 68 on 04/17/24 Has been compliant with moderate intensity statin/ezetimibe  : rosuvastatin  5 mg daily, ezetimibe  10 mg daily Not able to tolerate atorvastatin  secondary to myalgias Reviewed options for lowering LDL cholesterol, including PCSK-9 inhibitors, bempedoic acid and inclisiran.  Discussed mechanisms of action, dosing, side effects, potential decreases in LDL cholesterol and costs.  Also reviewed potential options for patient assistance. Rosuvastatin  and ezetimibe  had been discontinued after last labs 2/2 elevated liver enzymes.  AST/ALT were 44/41 respectively, likely safe to continue statin use and monitor. Patient has been taking a Liver Detox tablet since stopping medications Continue to monitor triglycerides - now on Mounjaro has noticed shift in eating habits.    Plan: Patient agreeable to re-starting rosuvastatin  5 mg daily and ezetimibe  10 mg daily Repeat liver function labs today.  Repeat labs after:  6-8 weeks Lipid Liver function    Allean Mink, PharmD CPP [facility] [address]   [place], [place] [address] [phone]  06/12/2024, 10:55 AM       [1]  Allergies Allergen Reactions   Fentanyl   Nausea And Vomiting   "

## 2024-06-12 NOTE — Assessment & Plan Note (Addendum)
 Assessment: Patient with DM2 at LDL goal of < 70 Most recent LDL 68 on 04/17/24 Has been compliant with moderate intensity statin/ezetimibe  : rosuvastatin  5 mg daily, ezetimibe  10 mg daily Not able to tolerate atorvastatin  secondary to myalgias Reviewed options for lowering LDL cholesterol, including PCSK-9 inhibitors, bempedoic acid and inclisiran.  Discussed mechanisms of action, dosing, side effects, potential decreases in LDL cholesterol and costs.  Also reviewed potential options for patient assistance. Rosuvastatin  and ezetimibe  had been discontinued after last labs 2/2 elevated liver enzymes.  AST/ALT were 44/41 respectively, likely safe to continue statin use and monitor. Patient has been taking a Liver Detox tablet since stopping medications Continue to monitor triglycerides - now on Mounjaro has noticed shift in eating habits.    Plan: Patient agreeable to re-starting rosuvastatin  5 mg daily and ezetimibe  10 mg daily Repeat liver function labs today.  Repeat labs after:  6-8 weeks Lipid Liver function

## 2024-06-13 LAB — HEPATIC FUNCTION PANEL
ALT: 40 IU/L — ABNORMAL HIGH (ref 0–32)
AST: 40 IU/L (ref 0–40)
Albumin: 4.6 g/dL (ref 3.9–4.9)
Alkaline Phosphatase: 73 IU/L (ref 41–116)
Bilirubin Total: 0.7 mg/dL (ref 0.0–1.2)
Bilirubin, Direct: 0.22 mg/dL (ref 0.00–0.40)
Total Protein: 7 g/dL (ref 6.0–8.5)

## 2024-06-16 ENCOUNTER — Ambulatory Visit: Payer: Self-pay | Admitting: Pharmacist Clinician (PhC)/ Clinical Pharmacy Specialist
